# Patient Record
Sex: Male | Born: 1945 | Race: White | Hispanic: No | Marital: Married | Smoking: Former smoker
Health system: Southern US, Community
[De-identification: ages and names within clinical notes are randomized; demographics above are authoritative.]

## PROBLEM LIST (undated history)

## (undated) DIAGNOSIS — E785 Hyperlipidemia, unspecified: Secondary | ICD-10-CM

## (undated) DIAGNOSIS — E8770 Fluid overload, unspecified: Secondary | ICD-10-CM

## (undated) DIAGNOSIS — I4510 Unspecified right bundle-branch block: Secondary | ICD-10-CM

## (undated) DIAGNOSIS — J45909 Unspecified asthma, uncomplicated: Secondary | ICD-10-CM

## (undated) DIAGNOSIS — Z973 Presence of spectacles and contact lenses: Secondary | ICD-10-CM

## (undated) DIAGNOSIS — I10 Essential (primary) hypertension: Secondary | ICD-10-CM

## (undated) DIAGNOSIS — K219 Gastro-esophageal reflux disease without esophagitis: Secondary | ICD-10-CM

## (undated) DIAGNOSIS — I2510 Atherosclerotic heart disease of native coronary artery without angina pectoris: Secondary | ICD-10-CM

## (undated) DIAGNOSIS — R351 Nocturia: Secondary | ICD-10-CM

## (undated) DIAGNOSIS — I219 Acute myocardial infarction, unspecified: Secondary | ICD-10-CM

## (undated) HISTORY — PX: CORONARY ANGIOPLASTY: SHX604

## (undated) HISTORY — PX: COLONOSCOPY: SHX174

## (undated) HISTORY — DX: Fluid overload, unspecified: E87.70

## (undated) HISTORY — DX: Unspecified right bundle-branch block: I45.10

---

## 2016-01-11 ENCOUNTER — Encounter (HOSPITAL_COMMUNITY): Payer: Self-pay

## 2016-01-11 ENCOUNTER — Inpatient Hospital Stay (HOSPITAL_COMMUNITY)
Admission: EM | Admit: 2016-01-11 | Discharge: 2016-01-14 | DRG: 249 | Disposition: A | Discharge status: Home / Self Care | Payer: Medicare Other | Attending: Cardiovascular Disease | Admitting: Cardiovascular Disease

## 2016-01-11 ENCOUNTER — Emergency Department (HOSPITAL_COMMUNITY): Payer: Medicare Other

## 2016-01-11 ENCOUNTER — Encounter (HOSPITAL_COMMUNITY)
Admission: EM | Disposition: A | Discharge status: Home / Self Care | Payer: Self-pay | Source: Home / Self Care | Attending: Cardiovascular Disease

## 2016-01-11 DIAGNOSIS — I2111 ST elevation (STEMI) myocardial infarction involving right coronary artery: Secondary | ICD-10-CM

## 2016-01-11 DIAGNOSIS — I213 ST elevation (STEMI) myocardial infarction of unspecified site: Secondary | ICD-10-CM

## 2016-01-11 DIAGNOSIS — Z8249 Family history of ischemic heart disease and other diseases of the circulatory system: Secondary | ICD-10-CM | POA: Diagnosis not present

## 2016-01-11 DIAGNOSIS — Z955 Presence of coronary angioplasty implant and graft: Secondary | ICD-10-CM

## 2016-01-11 DIAGNOSIS — I2129 ST elevation (STEMI) myocardial infarction involving other sites: Secondary | ICD-10-CM

## 2016-01-11 DIAGNOSIS — I214 Non-ST elevation (NSTEMI) myocardial infarction: Secondary | ICD-10-CM | POA: Diagnosis present

## 2016-01-11 DIAGNOSIS — I110 Hypertensive heart disease with heart failure: Secondary | ICD-10-CM | POA: Diagnosis not present

## 2016-01-11 DIAGNOSIS — I10 Essential (primary) hypertension: Secondary | ICD-10-CM | POA: Diagnosis present

## 2016-01-11 DIAGNOSIS — Z87891 Personal history of nicotine dependence: Secondary | ICD-10-CM | POA: Diagnosis not present

## 2016-01-11 DIAGNOSIS — E785 Hyperlipidemia, unspecified: Secondary | ICD-10-CM | POA: Diagnosis present

## 2016-01-11 DIAGNOSIS — I2510 Atherosclerotic heart disease of native coronary artery without angina pectoris: Secondary | ICD-10-CM | POA: Diagnosis present

## 2016-01-11 HISTORY — PX: CARDIAC CATHETERIZATION: SHX172

## 2016-01-11 HISTORY — DX: Essential (primary) hypertension: I10

## 2016-01-11 HISTORY — DX: Hyperlipidemia, unspecified: E78.5

## 2016-01-11 LAB — CBC
HCT: 42.8 % (ref 39.0–52.0)
Hemoglobin: 14.6 g/dL (ref 13.0–17.0)
MCH: 30.6 pg (ref 26.0–34.0)
MCHC: 34.1 g/dL (ref 30.0–36.0)
MCV: 89.7 fL (ref 78.0–100.0)
PLATELETS: 194 10*3/uL (ref 150–400)
RBC: 4.77 MIL/uL (ref 4.22–5.81)
RDW: 12.7 % (ref 11.5–15.5)
WBC: 13.8 10*3/uL — ABNORMAL HIGH (ref 4.0–10.5)

## 2016-01-11 LAB — BASIC METABOLIC PANEL
Anion gap: 8 (ref 5–15)
BUN: 13 mg/dL (ref 6–20)
CALCIUM: 10.8 mg/dL — AB (ref 8.9–10.3)
CHLORIDE: 105 mmol/L (ref 101–111)
CO2: 23 mmol/L (ref 22–32)
CREATININE: 1.19 mg/dL (ref 0.61–1.24)
GFR calc non Af Amer: >60 mL/min (ref >60)
Glucose, Bld: 112 mg/dL — ABNORMAL HIGH (ref 65–99)
Potassium: 3.8 mmol/L (ref 3.5–5.1)
SODIUM: 136 mmol/L (ref 135–145)

## 2016-01-11 LAB — DIFFERENTIAL
Basophils Absolute: 0 10*3/uL (ref 0.0–0.1)
Basophils Relative: 0 %
EOS ABS: 0.1 10*3/uL (ref 0.0–0.7)
EOS PCT: 1 %
LYMPHS ABS: 1.7 10*3/uL (ref 0.7–4.0)
LYMPHS PCT: 13 %
MONO ABS: 0.8 10*3/uL (ref 0.1–1.0)
MONOS PCT: 6 %
Neutro Abs: 11.2 10*3/uL — ABNORMAL HIGH (ref 1.7–7.7)
Neutrophils Relative %: 81 %

## 2016-01-11 LAB — PROTIME-INR
INR: 1.03 (ref 0.00–1.49)
Prothrombin Time: 13.7 seconds (ref 11.6–15.2)

## 2016-01-11 LAB — I-STAT TROPONIN, ED: TROPONIN I, POC: 1.13 ng/mL — AB (ref 0.00–0.08)

## 2016-01-11 LAB — LIPID PANEL
CHOLESTEROL: 228 mg/dL — AB (ref 0–200)
HDL: 48 mg/dL (ref >40)
LDL CALC: 157 mg/dL — AB (ref 0–99)
TRIGLYCERIDES: 116 mg/dL (ref <150)
Total CHOL/HDL Ratio: 4.8 RATIO
VLDL: 23 mg/dL (ref 0–40)

## 2016-01-11 LAB — APTT: aPTT: 30 seconds (ref 24–37)

## 2016-01-11 LAB — TROPONIN I
TROPONIN I: 0.83 ng/mL — AB (ref <0.03)
TROPONIN I: 14.62 ng/mL — AB (ref <0.03)

## 2016-01-11 LAB — MRSA PCR SCREENING: MRSA by PCR: NEGATIVE

## 2016-01-11 SURGERY — LEFT HEART CATH AND CORONARY ANGIOGRAPHY
Anesthesia: LOCAL

## 2016-01-11 MED ORDER — SODIUM CHLORIDE 0.9 % IV SOLN
INTRAVENOUS | Status: AC
  Administered 2016-01-11: 21:00:00 via INTRAVENOUS

## 2016-01-11 MED ORDER — LIDOCAINE HCL (PF) 1 % IJ SOLN
INTRAMUSCULAR | Status: AC
  Filled 2016-01-11: qty 30

## 2016-01-11 MED ORDER — BIVALIRUDIN BOLUS VIA INFUSION - CUPID
INTRAVENOUS | Status: DC | PRN
  Administered 2016-01-11: 83.325 mg via INTRAVENOUS

## 2016-01-11 MED ORDER — METOPROLOL TARTRATE 25 MG PO TABS
25.0000 mg | ORAL_TABLET | Freq: Two times a day (BID) | ORAL | Status: DC
  Administered 2016-01-12 – 2016-01-14 (×5): 25 mg via ORAL
  Filled 2016-01-11 (×6): qty 1

## 2016-01-11 MED ORDER — ATROPINE SULFATE 1 MG/10ML IJ SOSY
PREFILLED_SYRINGE | INTRAMUSCULAR | Status: AC
  Filled 2016-01-11: qty 10

## 2016-01-11 MED ORDER — FENTANYL CITRATE (PF) 100 MCG/2ML IJ SOLN
INTRAMUSCULAR | Status: DC | PRN
  Administered 2016-01-11: 50 ug via INTRAVENOUS
  Administered 2016-01-11 (×2): 25 ug via INTRAVENOUS

## 2016-01-11 MED ORDER — ATROPINE SULFATE 1 MG/10ML IJ SOSY
PREFILLED_SYRINGE | INTRAMUSCULAR | Status: DC | PRN
  Administered 2016-01-11 (×2): 0.5 mg via INTRAVENOUS

## 2016-01-11 MED ORDER — BIVALIRUDIN 250 MG IV SOLR
INTRAVENOUS | Status: AC
  Filled 2016-01-11: qty 250

## 2016-01-11 MED ORDER — SODIUM CHLORIDE 0.9 % IV SOLN: 10.0000 mL/h | INTRAVENOUS | Status: DC

## 2016-01-11 MED ORDER — FENTANYL CITRATE (PF) 100 MCG/2ML IJ SOLN
INTRAMUSCULAR | Status: AC
  Filled 2016-01-11: qty 2

## 2016-01-11 MED ORDER — TICAGRELOR 90 MG PO TABS
90.0000 mg | ORAL_TABLET | Freq: Two times a day (BID) | ORAL | Status: DC
  Administered 2016-01-12 – 2016-01-14 (×5): 90 mg via ORAL
  Filled 2016-01-11 (×5): qty 1

## 2016-01-11 MED ORDER — ATORVASTATIN CALCIUM 80 MG PO TABS
80.0000 mg | ORAL_TABLET | Freq: Every day | ORAL | Status: DC
  Administered 2016-01-11 – 2016-01-13 (×3): 80 mg via ORAL
  Filled 2016-01-11 (×2): qty 1

## 2016-01-11 MED ORDER — MIDAZOLAM HCL 2 MG/2ML IJ SOLN
INTRAMUSCULAR | Status: DC | PRN
  Administered 2016-01-11: 2 mg via INTRAVENOUS

## 2016-01-11 MED ORDER — MIDAZOLAM HCL 2 MG/2ML IJ SOLN
INTRAMUSCULAR | Status: AC
  Filled 2016-01-11: qty 2

## 2016-01-11 MED ORDER — ASPIRIN 81 MG PO CHEW
81.0000 mg | CHEWABLE_TABLET | Freq: Every day | ORAL | Status: DC
  Administered 2016-01-12 – 2016-01-14 (×3): 81 mg via ORAL
  Filled 2016-01-11 (×3): qty 1

## 2016-01-11 MED ORDER — BIVALIRUDIN 250 MG IV SOLR
250.0000 mg | INTRAVENOUS | Status: DC | PRN
  Administered 2016-01-11: 1.75 mg/kg/h via INTRAVENOUS

## 2016-01-11 MED ORDER — HEPARIN SODIUM (PORCINE) 5000 UNIT/ML IJ SOLN
4000.0000 [IU] | INTRAMUSCULAR | Status: AC
  Administered 2016-01-11: 4000 [IU] via INTRAVENOUS
  Filled 2016-01-11: qty 1

## 2016-01-11 MED ORDER — SODIUM CHLORIDE 0.9% FLUSH: 3.0000 mL | INTRAVENOUS | Status: DC | PRN

## 2016-01-11 MED ORDER — ASPIRIN 81 MG PO CHEW
324.0000 mg | CHEWABLE_TABLET | Freq: Once | ORAL | Status: AC
  Administered 2016-01-11: 324 mg via ORAL
  Filled 2016-01-11: qty 4

## 2016-01-11 MED ORDER — TICAGRELOR 90 MG PO TABS
ORAL_TABLET | ORAL | Status: DC | PRN
  Administered 2016-01-11: 180 mg via ORAL

## 2016-01-11 MED ORDER — VERAPAMIL HCL 2.5 MG/ML IV SOLN
INTRAVENOUS | Status: AC
  Filled 2016-01-11: qty 2

## 2016-01-11 MED ORDER — SODIUM CHLORIDE 0.9% FLUSH
3.0000 mL | Freq: Two times a day (BID) | INTRAVENOUS | Status: DC
  Administered 2016-01-12 – 2016-01-13 (×5): 3 mL via INTRAVENOUS

## 2016-01-11 MED ORDER — IOPAMIDOL (ISOVUE-370) INJECTION 76%
INTRAVENOUS | Status: AC
  Filled 2016-01-11: qty 125

## 2016-01-11 MED ORDER — NITROGLYCERIN 1 MG/10 ML FOR IR/CATH LAB
INTRA_ARTERIAL | Status: AC
Start: 2016-01-11 — End: 2016-01-11
  Filled 2016-01-11: qty 10

## 2016-01-11 MED ORDER — IOPAMIDOL (ISOVUE-370) INJECTION 76%
INTRAVENOUS | Status: AC
Start: 2016-01-11 — End: 2016-01-11
  Filled 2016-01-11: qty 50

## 2016-01-11 MED ORDER — TICAGRELOR 90 MG PO TABS
ORAL_TABLET | ORAL | Status: AC
  Filled 2016-01-11: qty 2

## 2016-01-11 MED ORDER — MORPHINE SULFATE (PF) 2 MG/ML IV SOLN: 2.0000 mg | INTRAVENOUS | Status: DC | PRN

## 2016-01-11 MED ORDER — LIDOCAINE HCL (PF) 1 % IJ SOLN
INTRAMUSCULAR | Status: DC | PRN
  Administered 2016-01-11: 3 mL

## 2016-01-11 MED ORDER — HEPARIN (PORCINE) IN NACL 2-0.9 UNIT/ML-% IJ SOLN
INTRAMUSCULAR | Status: AC
  Filled 2016-01-11: qty 1000

## 2016-01-11 MED ORDER — SODIUM CHLORIDE 0.9 % IV SOLN: 250.0000 mL | INTRAVENOUS | Status: DC | PRN

## 2016-01-11 MED ORDER — ONDANSETRON HCL 4 MG/2ML IJ SOLN: 4.0000 mg | Freq: Four times a day (QID) | INTRAMUSCULAR | Status: DC | PRN

## 2016-01-11 MED ORDER — OXYCODONE-ACETAMINOPHEN 5-325 MG PO TABS: 1.0000 | ORAL_TABLET | ORAL | Status: DC | PRN

## 2016-01-11 MED ORDER — VERAPAMIL HCL 2.5 MG/ML IV SOLN
INTRAVENOUS | Status: DC | PRN
  Administered 2016-01-11: 19:00:00 via INTRA_ARTERIAL

## 2016-01-11 MED ORDER — IOPAMIDOL (ISOVUE-370) INJECTION 76%
INTRAVENOUS | Status: DC | PRN
  Administered 2016-01-11: 150 mL via INTRAVENOUS

## 2016-01-11 MED ORDER — ACETAMINOPHEN 325 MG PO TABS: 650.0000 mg | ORAL_TABLET | ORAL | Status: DC | PRN

## 2016-01-11 SURGICAL SUPPLY — 19 items
BALLN EMERGE MR 2.0X12 (BALLOONS) ×2
BALLN ~~LOC~~ EMERGE MR 2.75X15 (BALLOONS) ×2
BALLOON EMERGE MR 2.0X12 (BALLOONS) ×1 IMPLANT
BALLOON ~~LOC~~ EMERGE MR 2.75X15 (BALLOONS) ×1 IMPLANT
CATH INFINITI 5 FR JL3.5 (CATHETERS) ×2 IMPLANT
CATH INFINITI 5FR ANG PIGTAIL (CATHETERS) ×2 IMPLANT
CATH INFINITI JR4 5F (CATHETERS) ×2 IMPLANT
CATH VISTA GUIDE 6FR JR4 (CATHETERS) ×2 IMPLANT
DEVICE RAD COMP TR BAND LRG (VASCULAR PRODUCTS) ×2 IMPLANT
GLIDESHEATH SLEND SS 6F .021 (SHEATH) ×2 IMPLANT
KIT ENCORE 26 ADVANTAGE (KITS) ×2 IMPLANT
KIT HEART LEFT (KITS) ×2 IMPLANT
PACK CARDIAC CATHETERIZATION (CUSTOM PROCEDURE TRAY) ×2 IMPLANT
STENT NIRXCELL 2.5X20 (Permanent Stent) ×2 IMPLANT
SYR MEDRAD MARK V 150ML (SYRINGE) ×2 IMPLANT
TRANSDUCER W/STOPCOCK (MISCELLANEOUS) ×2 IMPLANT
TUBING CIL FLEX 10 FLL-RA (TUBING) ×2 IMPLANT
WIRE COUGAR XT STRL 190CM (WIRE) ×2 IMPLANT
WIRE SAFE-T 1.5MM-J .035X260CM (WIRE) ×2 IMPLANT

## 2016-01-11 NOTE — H&P (Signed)
Patient ID: Bobby Price MRN: [id number] DOB/AGE: 1945-11-07 70 y.o. Admit date: 01/11/2016  Primary Care Physician: No primary care provider on file. Primary Cardiologist: New  HPI: 70 yo male with history of HTN, HLD who presented to the ED today with c/o chest pain x 8 hours associated with nausea. EKG with 1 mm ST elevation lead III. No reciprocal changes noted. Pt with ongoing pain. Cath lab activated for urgent cath. Pt denies prior heart disease and states that he has never been in a hospital. He used to be on BP and cholesterol medications but has not been taking. Smoked 4ppd but quit in 1976.   Review of systems complete and found to be negative unless listed above   Past Medical History  Diagnosis Date  . Hypertension   . Hyperlipidemia     Family History  Problem Relation Age of Onset  . Heart attack Maternal Grandfather     Social History   Social History  . Marital Status: N/A    Spouse Name: N/A  . Number of Children: N/A  . Years of Education: N/A   Occupational History  . Not on file.   Social History Main Topics  . Smoking status: Former Smoker -- 4.00 packs/day for 10 years    Types: Cigarettes  . Smokeless tobacco: Not on file  . Alcohol Use: 0.0 oz/week    0 Standard drinks or equivalent per week     Comment: He drinks up to 6 beers per day  . Drug Use: No  . Sexual Activity: Not on file   Other Topics Concern  . Not on file   Social History Narrative  . No narrative on file    Past Surgical History  Procedure Laterality Date  . None      No Known Allergies  Prior to Admission Meds:  None  Physical Exam: Blood pressure 118/62, pulse 47, temperature 99.1 F (37.3 C), temperature source Oral, resp. rate 17, height 6\' 2"  (1.88 m), weight 245 lb (111.131 kg), SpO2 99 %.    General: Well developed, well nourished, NAD  HEENT: OP clear, mucus membranes moist  SKIN: warm, dry. No rashes.  Neuro: No focal deficits  Musculoskeletal:  Muscle strength 5/5 all ext  Psychiatric: Mood and affect normal  Neck: No JVD, no carotid bruits, no thyromegaly, no lymphadenopathy.  Lungs:Clear bilaterally, no wheezes, rhonci, crackles  Cardiovascular: Regular rate and rhythm. No murmurs, gallops or rubs.  Abdomen:Soft. Bowel sounds present. Non-tender.  Extremities: No lower extremity edema. Pulses are 2 + in the bilateral DP/PT.  Labs:   Lab Results  Component Value Date   WBC 13.8* 01/11/2016   HGB 14.6 01/11/2016   HCT 42.8 01/11/2016   MCV 89.7 01/11/2016   PLT 194 01/11/2016   No results for input(s): NA, K, CL, CO2, BUN, CREATININE, CALCIUM, PROT, BILITOT, ALKPHOS, ALT, AST, GLUCOSE in the last 168 hours.  Invalid input(s): LABALBU No results found for: CKTOTAL, CKMB, CKMBINDEX, TROPONINI No results found for: CHOL No results found for: HDL No results found for: LDLCALC No results found for: TRIG No results found for: CHOLHDL No results found for: LDLDIRECT     EKG: sinus bradycardia. 1 mm ST elevation lead III.   ASSESSMENT AND PLAN:   1. Acute coronary syndrome/NSTEMI: Pt with ongoing chest pain. EKG not diagnostic for STEMI but concerning for acute MI. Troponin 1.1. Will plan urgent cath tonight. Further plans to follow after cath.   Earney Hamburghris Deriana Vanderhoef,  MD 01/11/2016, 6:58 PM

## 2016-01-11 NOTE — ED Notes (Signed)
Patient complains of central chest pain since 10am, states he thought indigestion after eating sausage this am.  Nausea with ame. No other associated symptoms. Took antacids with no relief

## 2016-01-11 NOTE — ED Notes (Signed)
stemi pads placed on patient. Xray at bedside.

## 2016-01-11 NOTE — Progress Notes (Signed)
Pt. Arrived from cath lab at around 22:30.  Troponin at 14.62.  Pt. HR is sustained in low 50's, dropping sometimes to mid 40's.  MD made aware, said as long as asymptomatic that HR is ok.  If it goes below 35 to call back. Will continue to monitor.

## 2016-01-11 NOTE — ED Provider Notes (Signed)
CSN: [id number]     Arrival date & time 01/11/16  1756 History   First MD Initiated Contact with Patient 01/11/16 1815     No chief complaint on file.    (Consider location/radiation/quality/duration/timing/severity/associated sxs/prior Treatment) HPI Comments: Pt comes in with cc of chest pain. PT started having midsternal and substernal chest discomfort at 10 am, after breakfast. He thought he was having GERD. Since then the pain has been constant, 3/10. He denies any specific aggravating or relieving factors or radiation of the pain. Pt has had some feeling of shallow respiration and also episodes of nausea. Pt also reports 2 episodes of feelign clammy. Decided to come to the ER as the pain is not going away.   ROS 10 Systems reviewed and are negative for acute change except as noted in the HPI.     The history is provided by the patient.    Past Medical History  Diagnosis Date  . Hypertension    History reviewed. No pertinent past surgical history. No family history on file. Social History  Substance Use Topics  . Smoking status: Never Smoker   . Smokeless tobacco: None  . Alcohol Use: None    Review of Systems    Allergies  Review of patient's allergies indicates no known allergies.  Home Medications   Prior to Admission medications   Not on File   BP 140/73 mmHg  Pulse 49  Temp(Src) 99.1 F (37.3 C) (Oral)  Resp 18  Ht 6\' 2"  (1.88 m)  Wt 245 lb (111.131 kg)  BMI 31.44 kg/m2  SpO2 100% Physical Exam  Constitutional: He is oriented to person, place, and time. He appears well-developed.  HENT:  Head: Normocephalic and atraumatic.  Eyes: Conjunctivae and EOM are normal. Pupils are equal, round, and reactive to light.  Neck: Normal range of motion. Neck supple. No JVD present.  Cardiovascular: Normal rate, regular rhythm, normal heart sounds and intact distal pulses.   No murmur heard. Pulmonary/Chest: Effort normal and breath sounds normal. No  respiratory distress. He has no wheezes.  Abdominal: Soft. Bowel sounds are normal. He exhibits no distension. There is no tenderness. There is no rebound and no guarding.  Neurological: He is alert and oriented to person, place, and time.  Skin: Skin is warm.  Nursing note and vitals reviewed.   ED Course  .Critical Care Performed by: Derwood KaplanNANAVATI, Ryane Canavan Authorized by: Derwood KaplanNANAVATI, Zidan Helget Total critical care time: 38 minutes Critical care time was exclusive of separately billable procedures and treating other patients. Critical care was necessary to treat or prevent imminent or life-threatening deterioration of the following conditions: cardiac failure. Critical care was time spent personally by me on the following activities: blood draw for specimens, development of treatment plan with patient or surrogate, discussions with consultants, evaluation of patient's response to treatment, examination of patient, obtaining history from patient or surrogate, ordering and review of laboratory studies and pulse oximetry.   (including critical care time) Labs Review Labs Reviewed  I-STAT TROPOININ, ED - Abnormal; Notable for the following:    Troponin i, poc 1.13 (*)    All other components within normal limits  BASIC METABOLIC PANEL  CBC  DIFFERENTIAL  PROTIME-INR  APTT  TROPONIN I  LIPID PANEL  I-STAT TROPOININ, ED    Imaging Review No results found. I have personally reviewed and evaluated these images and lab results as part of my medical decision-making.   EKG Interpretation  EKG # 3 Date/Time:  Saturday January 11 2016  18:10:59 EDT Ventricular Rate:  44 PR Interval:  162 QRS Duration: 104 QT Interval:  430 QTC Calculation: 367 R Axis:   67 Text Interpretation:  Marked sinus bradycardia ST elevation consider  inferior injury or acute infarct - lead III and avF v@ has ST depression  Confirmed by Rhunette CroftNANAVATI, MD, Nillie Bartolotta [phone]) on 01/11/2016 6:20:49 PM   ekg # 4  EKG  Interpretation  Date/Time:  Saturday January 11 2016 18:23:20 EDT Ventricular Rate:  48 PR Interval:  162 QRS Duration: 113 QT Interval:  436 QTC Calculation: 390 R Axis:   65 Text Interpretation:  Sinus bradycardia Incomplete right bundle branch block ST elevation, consider inferior injury ST ELEVATION IN THE INFERIOR leads III and aVF, mild depression in avL Confirmed by Reuven Braver, MD, Janey GentaANKIT ([id number]) on 01/11/2016 6:33:21 PM           MDM   Final diagnoses:  Acute ST elevation myocardial infarction (STEMI) of posterior wall (HCC)    .Differential diagnosis includes: ACS syndrome CHF exacerbation Myocarditis Pericarditis GERD Esophageal spasm  Pt comes in with cc of chest pain. Hx of HTN. No hx of similar pain in the past. Pain started few minutes after breakfast, and is constant. Pain is not exertional. Pt has some dyspnea and nausea, and sweats. Initial ekg x 2 has some subtle ST elevation in the inferior leads, most pronounced in the lead III Story was concerning, but has some atypical features - and we repeated ekg x 2 more times - and got a posterior ekg, and it seems like ST elevation has worsened slightly in the inferior leads and posterior leads have slight ST elevation - CODE STEMI activated before 6:30pm.  Trop is elevated slightly.  Derwood KaplanAnkit Leroy Pettway, MD 01/11/16 [phone]

## 2016-01-11 NOTE — Progress Notes (Signed)
   01/11/16 1830  Clinical Encounter Type  Visited With Patient  Visit Type ED  Referral From Nurse  Spiritual Encounters  Spiritual Needs Emotional  Chaplain responded to a Code Stemi in the ED.  Chaplain checked in with the nurse.  Chaplain visited with patient.  Patient was alert and smiling.  Chaplain offered spiritual support and made further support available if needed.

## 2016-01-12 ENCOUNTER — Inpatient Hospital Stay (HOSPITAL_COMMUNITY): Payer: Medicare Other

## 2016-01-12 DIAGNOSIS — I2510 Atherosclerotic heart disease of native coronary artery without angina pectoris: Secondary | ICD-10-CM

## 2016-01-12 DIAGNOSIS — I213 ST elevation (STEMI) myocardial infarction of unspecified site: Secondary | ICD-10-CM

## 2016-01-12 LAB — CBC
HEMATOCRIT: 37.3 % — AB (ref 39.0–52.0)
HEMOGLOBIN: 12.3 g/dL — AB (ref 13.0–17.0)
MCH: 29.6 pg (ref 26.0–34.0)
MCHC: 33 g/dL (ref 30.0–36.0)
MCV: 89.9 fL (ref 78.0–100.0)
Platelets: 159 10*3/uL (ref 150–400)
RBC: 4.15 MIL/uL — AB (ref 4.22–5.81)
RDW: 13.1 % (ref 11.5–15.5)
WBC: 11.9 10*3/uL — AB (ref 4.0–10.5)

## 2016-01-12 LAB — ECHOCARDIOGRAM COMPLETE
E decel time: 215 msec
E/e' ratio: 9.73
FS: 20 % — AB (ref 28–44)
HEIGHTINCHES: 74 in
IVS/LV PW RATIO, ED: 1.03
LA ID, A-P, ES: 42 mm
LA diam index: 1.75 cm/m2
LA vol A4C: 42.9 ml
LA vol index: 19.9 mL/m2
LA vol: 47.7 mL
LDCA: 2.27 cm2
LEFT ATRIUM END SYS DIAM: 42 mm
LV TDI E'MEDIAL: 8.92
LV e' LATERAL: 9.57 cm/s
LVEEAVG: 9.73
LVEEMED: 9.73
LVOT VTI: 24.2 cm
LVOT peak vel: 107 cm/s
LVOTD: 17 mm
LVOTSV: 55 mL
MV Dec: 215
MV pk A vel: 83.9 m/s
MV pk E vel: 93.1 m/s
MVPG: 3 mmHg
PW: 14.7 mm — AB (ref 0.6–1.1)
TDI e' lateral: 9.57
WEIGHTICAEL: 3816.6 [oz_av]

## 2016-01-12 LAB — BASIC METABOLIC PANEL
Anion gap: 8 (ref 5–15)
BUN: 11 mg/dL (ref 6–20)
CALCIUM: 9.3 mg/dL (ref 8.9–10.3)
CO2: 22 mmol/L (ref 22–32)
CREATININE: 1.05 mg/dL (ref 0.61–1.24)
Chloride: 105 mmol/L (ref 101–111)
GFR calc Af Amer: >60 mL/min (ref >60)
GLUCOSE: 118 mg/dL — AB (ref 65–99)
POTASSIUM: 3.6 mmol/L (ref 3.5–5.1)
SODIUM: 135 mmol/L (ref 135–145)

## 2016-01-12 LAB — TROPONIN I
Troponin I: 38.63 ng/mL (ref <0.03)
Troponin I: 41.98 ng/mL (ref <0.03)

## 2016-01-12 MED ORDER — PERFLUTREN LIPID MICROSPHERE
1.0000 mL | INTRAVENOUS | Status: AC | PRN
  Administered 2016-01-12: 2 mL via INTRAVENOUS
  Filled 2016-01-12: qty 10

## 2016-01-12 MED ORDER — ATROPINE SULFATE 1 MG/10ML IJ SOSY
PREFILLED_SYRINGE | INTRAMUSCULAR | Status: AC
  Filled 2016-01-12: qty 10

## 2016-01-12 MED ORDER — PERFLUTREN LIPID MICROSPHERE
INTRAVENOUS | Status: AC
  Administered 2016-01-12: 2 mL
  Filled 2016-01-12: qty 10

## 2016-01-12 NOTE — Progress Notes (Signed)
TR band off at 0100.  Dressing applied.  Will continue to monitor.

## 2016-01-12 NOTE — Progress Notes (Signed)
Utilization Review Completed.Bobby Price T7/08/2015  

## 2016-01-12 NOTE — Progress Notes (Signed)
   SUBJECTIVE: The patient is doing well today.  At this time, he denies chest pain, shortness of breath, or any new concerns.  Marland Kitchen. aspirin  81 mg Oral Daily  . atorvastatin  80 mg Oral q1800  . atropine      . metoprolol tartrate  25 mg Oral BID  . sodium chloride flush  3 mL Intravenous Q12H  . ticagrelor  90 mg Oral BID   . sodium chloride    . sodium chloride      OBJECTIVE: Physical Exam: Filed Vitals:   01/12/16 0500 01/12/16 0600 01/12/16 0700 01/12/16 0800  BP: 104/63 98/55 91/60  100/62  Pulse: 45 42 46 45  Temp:    98.2 F (36.8 C)  TempSrc:    Oral  Resp: 14 23 11 12   Height:      Weight:      SpO2: 97% 96% 97% 97%    Intake/Output Summary (Last 24 hours) at 01/12/16 0948 Last data filed at 01/12/16 0500  Gross per 24 hour  Intake 713.09 ml  Output    700 ml  Net  13.09 ml    Telemetry reveals sinus rhythm with occasional sinus bradycardia  GEN- The patient is well appearing, alert and oriented x 3 today.   Head- normocephalic, atraumatic Eyes-  Sclera clear, conjunctiva pink Ears- hearing intact Oropharynx- clear Neck- supple,  Lungs- Clear to ausculation bilaterally, normal work of breathing Heart- Regular rate and rhythm, no murmurs, rubs or gallops, PMI not laterally displaced GI- soft, NT, ND, + BS Extremities- no clubbing, cyanosis, or edema Skin- no rash or lesion Psych- euthymic mood, full affect Neuro- strength and sensation are intact  LABS: Basic Metabolic Panel:  Recent Labs  45/40/9805/07/29 1807 01/12/16 0224  NA 136 135  K 3.8 3.6  CL 105 105  CO2 23 22  GLUCOSE 112* 118*  BUN 13 11  CREATININE 1.19 1.05  CALCIUM 10.8* 9.3   Liver Function Tests: No results for input(s): AST, ALT, ALKPHOS, BILITOT, PROT, ALBUMIN in the last 72 hours. No results for input(s): LIPASE, AMYLASE in the last 72 hours. CBC:  Recent Labs  01/11/16 1807 01/12/16 0224  WBC 13.8* 11.9*  NEUTROABS 11.2*  --   HGB 14.6 12.3*  HCT 42.8 37.3*  MCV  89.7 89.9  PLT 194 159   Cardiac Enzymes:  Recent Labs  01/11/16 2059 01/12/16 0224 01/12/16 0827  TROPONINI 14.62* 38.63* 41.98*   Fasting Lipid Panel:  Recent Labs  01/11/16 1807  CHOL 228*  HDL 48  LDLCALC 157*  TRIG 116  CHOLHDL 4.8   ASSESSMENT AND PLAN:  Active Problems:   NSTEMI (non-ST elevated myocardial infarction) (HCC)  1. NSTEMI Doing well s/p PCI of RCA.  Pt with 3 vessel disease.  Per Dr Clifton JamesMcAlhany, plan is for cath films to be reviewed with surgery on Monday with consideration of CABG in 1 month.  I have discussed this plan with the patient.  He is reluctant to consider CABG as his wife has end stage COPD on 5L O2 and is dependant on him for the majority of his care. For now, will continue to optimize medical therapy until surgical discussion can be had tomorrow. Echo pending On ASA, ticagrelor, high dose atorvastatin, metoprolol  2. HTN Stable No change required today Improved  Downgrade to stepdown  Hillis RangeJames Otho Michalik, MD 01/12/2016 9:48 AM

## 2016-01-12 NOTE — Progress Notes (Signed)
Echocardiogram 2D Echocardiogram with Definity has been performed.  Bobby Price, Bobby 01/12/2016, 2:44 PM

## 2016-01-13 ENCOUNTER — Encounter (HOSPITAL_COMMUNITY): Payer: Self-pay | Admitting: Cardiovascular Disease

## 2016-01-13 ENCOUNTER — Other Ambulatory Visit: Payer: Self-pay | Admitting: *Deleted

## 2016-01-13 DIAGNOSIS — I10 Essential (primary) hypertension: Secondary | ICD-10-CM

## 2016-01-13 DIAGNOSIS — I214 Non-ST elevation (NSTEMI) myocardial infarction: Principal | ICD-10-CM

## 2016-01-13 DIAGNOSIS — I25110 Atherosclerotic heart disease of native coronary artery with unstable angina pectoris: Secondary | ICD-10-CM

## 2016-01-13 DIAGNOSIS — I2510 Atherosclerotic heart disease of native coronary artery without angina pectoris: Secondary | ICD-10-CM

## 2016-01-13 LAB — POCT ACTIVATED CLOTTING TIME: Activated Clotting Time: 577 seconds

## 2016-01-13 MED FILL — Heparin Sodium (Porcine) 2 Unit/ML in Sodium Chloride 0.9%: INTRAMUSCULAR | Qty: 500 | Status: AC

## 2016-01-13 MED FILL — Nitroglycerin IV Soln 100 MCG/ML in D5W: INTRA_ARTERIAL | Qty: 10 | Status: AC

## 2016-01-13 NOTE — Progress Notes (Signed)
SUBJECTIVE: No chest pain or dyspnea.   Tele: sinus  BP 106/64 mmHg  Pulse 60  Temp(Src) 98.4 F (36.9 C) (Oral)  Resp 12  Ht 6\' 2"  (1.88 m)  Wt 238 lb 8.6 oz (108.2 kg)  BMI 30.61 kg/m2  SpO2 97%  Intake/Output Summary (Last 24 hours) at 01/13/16 0754 Last data filed at 01/13/16 0000  Gross per 24 hour  Intake      0 ml  Output   1750 ml  Net  -1750 ml    PHYSICAL EXAM General: Well developed, well nourished, in no acute distress. Alert and oriented x 3.  Psych:  Good affect, responds appropriately Neck: No JVD. No masses noted.  Lungs: Clear bilaterally with no wheezes or rhonci noted.  Heart: RRR with no murmurs noted. Abdomen: Bowel sounds are present. Soft, non-tender.  Extremities: No lower extremity edema.   LABS: Basic Metabolic Panel:  Recent Labs  16/04/9606/01/17 1807 01/12/16 0224  NA 136 135  K 3.8 3.6  CL 105 105  CO2 23 22  GLUCOSE 112* 118*  BUN 13 11  CREATININE 1.19 1.05  CALCIUM 10.8* 9.3   CBC:  Recent Labs  01/11/16 1807 01/12/16 0224  WBC 13.8* 11.9*  NEUTROABS 11.2*  --   HGB 14.6 12.3*  HCT 42.8 37.3*  MCV 89.7 89.9  PLT 194 159   Cardiac Enzymes:  Recent Labs  01/11/16 2059 01/12/16 0224 01/12/16 0827  TROPONINI 14.62* 38.63* 41.98*   Fasting Lipid Panel:  Recent Labs  01/11/16 1807  CHOL 228*  HDL 48  LDLCALC 157*  TRIG 116  CHOLHDL 4.8    Current Meds: . aspirin  81 mg Oral Daily  . atorvastatin  80 mg Oral q1800  . metoprolol tartrate  25 mg Oral BID  . sodium chloride flush  3 mL Intravenous Q12H  . ticagrelor  90 mg Oral BID   Echo 01/12/16: Procedure narrative: Transthoracic echocardiography. Technically  difficult study with reduced echocardiographic windows and poor  endocardial definition. Intravenous contrast (Definity) was  administered. - Left ventricle: The cavity size was normal. There was moderate  concentric hypertrophy. Systolic function was normal. The  estimated ejection  fraction was in the range of 60% to 65%.  Definity contrast images demonstrate normal wall motion. No LV  thrombus was noted. The study is not technically sufficient to  allow evaluation of LV diastolic function. - Mitral valve: Mildly thickened leaflets . There was trivial  regurgitation. - Left atrium: The atrium was normal in size. - Inferior vena cava: The vessel was dilated. The respirophasic  diameter changes were blunted (< 50%), consistent with elevated  central venous pressure. Impressions: - Technically difficult study. Contrast was given. Wall motion  appears normal. LVEF 60-65%.  Cardiac cath 01/11/16:  Mid RCA lesion, 50% stenosed.  Ost Cx to Prox Cx lesion, 50% stenosed.  LM-1 lesion, 50% stenosed.  LM-2 lesion, 80% stenosed.  Prox Cx lesion, 70% stenosed.  Ost 2nd Mrg to 2nd Mrg lesion, 70% stenosed.  Prox LAD to Mid LAD lesion, 20% stenosed.  Dist LAD-1 lesion, 50% stenosed.  Dist LAD-2 lesion, 20% stenosed.  Dist RCA lesion, 100% stenosed. Post intervention, there is a 0% residual stenosis.  The left ventricular systolic function is normal.  1. Acute MI/NSTEMI secondary to occluded distal RCA 2. Successful PTCA/bare metal stent placement x 1 distal RCA  3. There is complex disease involving the distal left main (50%), ostial LAD (80%) and ostial Circumflex (  50%). The mid RCA has moderate stenosis. The proximal Circumflex and OM1 has moderately severe stenosis.  4. Overall preserved LV systolic function  ASSESSMENT AND PLAN:  1. CAD/NSTEMI:  Pt admitted 01/11/16 with chest pain, NSTEMI. The distal RCA was occluded and treated with a bare metal stent. He has high grade ostial LAD disease with moderate disease in the distal left main and ostial Circumflex. The plan will be to involve CT surgery to discuss bypass of the LAD and Circumflex. I will continue ASA and Brilinta along with statin and beta blocker for at least one month. Since a bare metal  stent was placed in the RCA, we could stop Brilinta in one month for CABG. His anatomy is not favorable for PCI/stenting of the LAD given the distal left main stenosis and ostial Circumflex disease.  He is doing well this am. Will transfer to telemetry unit today.   2. HTN: BP controlled. Continue beta blocker.   Verne Carrowhristopher Macklyn Glandon  7/3/20177:54 AM

## 2016-01-13 NOTE — Progress Notes (Signed)
CARDIAC REHAB PHASE I   PRE:  Rate/Rhythm: 61 SR  BP:  Sitting: 133/76        SaO2: 98 RA  MODE:  Ambulation: 550 ft   POST:  Rate/Rhythm: 58 SB  BP:  Sitting: 140/82         SaO2: 99 RA  Pt ambulated 550 ft on RA, independent, steady gait, tolerated well, denies any complaints. Pt states he is eager to get home to his wife who he cares for. Completed MI/stent education.  Reviewed risk factors, MI book, anti-platelet therapy, stent card (pt did not receive stent card), activity restrictions, ntg, exercise, heart healthy diet and phase 2 cardiac rehab. Pt verbalized understanding. Pt agrees to phase 2 cardiac rehab referral, although pt states it is unlikely he will attend. [facility]. Pt to recliner after walk, call bell within reach. Will follow up Wednesday if pt does not discharge tomorrow.   [phone] Joylene GrapesEmily C Arno Cullers, RN, BSN 01/13/2016 11:29 AM

## 2016-01-13 NOTE — Consult Note (Signed)
[address]       [place] [address]             [phone]        Bobby Price [facility] Medical Record #[id number] Date of Birth: May 01, 1946  Referring: No ref. provider found Primary Care: No primary care provider on file.  Chief Complaint:    Chief Complaint  Patient presents with  . 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 The patient presented to the emergency department and was noted have slight 1 mm ST segment elevation in the inferior lead. The patient has no previous history of hospitalizations or health problems. The patient's troponin was 1.1 and he underwent urgent cardiac catheterization which demonstrated occlusion of the RCA. This was treated with a bare metal stent. The patient also had significant disease in the proximal LAD and circumflex. LV function is normal. Echo shows no significant valvular disease. His cardiologist felt that he should undergo surgical coronary revascularization in the future after he recovers from his DMI    [troponin still elevating 42.0] and after the antiplatelet agents can be stopped safely. The patient currently is on Brilinta. He feels well without pain or shortness of breath. Cardiac cath site in right wrist is without hematoma or bleeding.  Current Activity/ Functional Status: Patient lives with his wife who is fairly invalid and takes care of the home and children and grandchildren   Zubrod Score: At the time of surgery this patient's most appropriate activity status/level should be described as:     0    Normal activity, no symptoms     1    Restricted in physical strenuous activity but ambulatory, able to do out  light work     2    Ambulatory and capable of self care, unable to do work activities, up and about                 more than 50%  Of the time                                3    Only limited self care, in bed greater than 50% of waking hours     4    Completely disabled, no self care, confined to bed or chair     5    Moribund  Past Medical History  Diagnosis Date  . Hypertension   . Hyperlipidemia     Past Surgical History  Procedure Laterality Date  . None    . Cardiac catheterization N/A 01/11/2016    Procedure: Left Heart Cath and Coronary Angiography;  Surgeon: [name], MD;  Location: [facility] INVASIVE CV LAB;  Service: Cardiovascular;  Laterality: N/A;    History  Smoking status  . Former Smoker -- 4.00 packs/day for 10 years  . Types: Cigarettes  Smokeless tobacco  . Not on file    History  Alcohol Use  . 0.0 oz/week  . 0 Standard drinks or equivalent per week    Comment: He drinks up to 6 beers per day    Social History   Social History  .  Marital Status: N/A    Spouse Name: N/A  . Number of Children: N/A  . Years of Education: N/A   Occupational History  . Not on file.   Social History Main Topics  . Smoking status: Former Smoker -- 4.00 packs/day for 10 years    Types: Cigarettes  . Smokeless tobacco: Not on file  . Alcohol Use: 0.0 oz/week    0 Standard drinks or equivalent per week     Comment: He drinks up to 6 beers per day  . Drug Use: No  . Sexual Activity: Not on file   Other Topics Concern  . Not on file   Social History Narrative    No Known Allergies  Current Facility-Administered Medications  Medication Dose Route Frequency Provider Last Rate Last Dose  . 0.9 %  sodium chloride infusion  10-20 mL/hr Intravenous Continuous Derwood KaplanAnkit Nanavati, MD   Stopped at 01/11/16 1830  . 0.9 %  sodium chloride infusion  10-20 mL/hr Intravenous Continuous Ankit Nanavati, MD      . 0.9 %  sodium chloride infusion  250 mL Intravenous  PRN Kathleene Hazelhristopher D McAlhany, MD      . acetaminophen (TYLENOL) tablet 650 mg  650 mg Oral Q4H PRN Kathleene Hazelhristopher D McAlhany, MD      . aspirin chewable tablet 81 mg  81 mg Oral Daily Kathleene Hazelhristopher D McAlhany, MD   81 mg at 01/13/16 0906  . atorvastatin (LIPITOR) tablet 80 mg  80 mg Oral q1800 Kathleene Hazelhristopher D McAlhany, MD   80 mg at 01/13/16 1859  . metoprolol tartrate (LOPRESSOR) tablet 25 mg  25 mg Oral BID Kathleene Hazelhristopher D McAlhany, MD   25 mg at 01/13/16 0906  . morphine 2 MG/ML injection 2 mg  2 mg Intravenous Q1H PRN Kathleene Hazelhristopher D McAlhany, MD      . ondansetron [facility](ZOFRAN) injection 4 mg  4 mg Intravenous Q6H PRN Kathleene Hazelhristopher D McAlhany, MD      . oxyCODONE-acetaminophen (PERCOCET/ROXICET) 5-325 MG per tablet 1-2 tablet  1-2 tablet Oral Q4H PRN Kathleene Hazelhristopher D McAlhany, MD      . sodium chloride flush (NS) 0.9 % injection 3 mL  3 mL Intravenous Q12H Kathleene Hazelhristopher D McAlhany, MD   3 mL at 01/13/16 0906  . sodium chloride flush (NS) 0.9 % injection 3 mL  3 mL Intravenous PRN Kathleene Hazelhristopher D McAlhany, MD      . ticagrelor [facility](BRILINTA) tablet 90 mg  90 mg Oral BID Kathleene Hazelhristopher D McAlhany, MD   90 mg at 01/13/16 [id number]    Prescriptions prior to admission  Medication Sig Dispense Refill Last Dose  . Travoprost, BAK Free, (TRAVATAN) 0.004 % SOLN ophthalmic solution Place 1 drop into both eyes daily as needed (spots in front of eyes).   couple days ago    Family History  Problem Relation Age of Onset  . Heart attack Maternal Grandfather      Review of Systems:       Cardiac Review of Systems: Y or N  Chest Pain [  Y   ]  Resting SOB [ no  ] Exertional SOB no   Orthopnea [ no ]   Pedal Edema [ n  ]    Palpitations [n  ] Syncope  [n  ]   Presyncope [ n  ] nausea-Y  General Review of Systems: [Y] = yes [  ]=no Constitional: recent weight change [  ]; anorexia [  ]; fatigue [  ]; nausea [  ]; night sweats [  ];  fever [  ]; or chills [  ]                                                               Dental: poor  dentition[  ]; Last Dentist visit: One year  Eye : blurred vision [  ]; diplopia [   ]; vision changes [  ];  Amaurosis fugax[  ]; Resp: cough [  ];  wheezing[  ];  hemoptysis[  ]; shortness of breath[  ]; paroxysmal nocturnal dyspnea[  ]; dyspnea on exertion[  ]; or orthopnea[  ];  GI:  gallstones[  ], vomiting[  ];  dysphagia[  ]; melena[  ];  hematochezia [  ]; heartburn[ yes  ];   Hx of  Colonoscopy[  ]; GU: kidney stones [  ]; hematuria[  ];   dysuria [  ];  nocturia[  ];  history of     obstruction [  ]; urinary frequency [  ]             Skin: rash, swelling[  ];, hair loss[  ];  peripheral edema[  ];  or itching[  ]; Musculosketetal: myalgias[  ];  joint swelling[  ];  joint erythema[  ];  joint pain[  ];  back pain[  ];  Heme/Lymph: bruising[  ];  bleeding[  ];  anemia[  ];  Neuro: TIA[  ];  headaches[  ];  stroke[  ];  vertigo[  ];  seizures[  ];   paresthesias[  ];  difficulty walking[  ];  Psych:depression[  ]; anxiety[  ];  Endocrine: diabetes[  ];  thyroid dysfunction[  ];  Immunizations: Flu [  ]; Pneumococcal[  ];  Other: Right-hand dominant, no history of thoracic trauma  Physical Exam: BP 108/61 mmHg  Pulse 57  Temp(Src) 98.1 F (36.7 C) (Oral)  Resp 18  Ht 6\' 2"  (1.88 m)  Wt 238 lb 8.6 oz (108.2 kg)  BMI 30.61 kg/m2  SpO2 100%       Physical Exam  General: Middle-aged well-developed Caucasian male no acute distress HEENT: Normocephalic pupils equal , dentition adequate Neck: Supple without JVD, adenopathy, or bruit Chest: Clear to auscultation, symmetrical breath sounds, no rhonchi, no tenderness             or deformity Cardiovascular: Regular rate and rhythm, no murmur, no gallop, peripheral pulses             palpable in all extremities Abdomen:  Soft, nontender, no palpable mass or organomegaly Extremities: Warm, well-perfused, no clubbing cyanosis edema or tenderness,              no venous stasis changes of the legs Rectal/GU: Deferred Neuro: Grossly  non--focal and symmetrical throughout Skin: Clean and dry without rash or ulceration   Diagnostic Studies & Laboratory data:     Recent Radiology Findings:   No results found.   I have independently reviewed the above radiologic studies.  Recent Lab Findings: Lab Results  Component Value Date   WBC 11.9* 01/12/2016   HGB 12.3* 01/12/2016   HCT 37.3* 01/12/2016   PLT 159 01/12/2016   GLUCOSE 118* 01/12/2016   CHOL 228* 01/11/2016   TRIG 116 01/11/2016   HDL 48 01/11/2016   LDLCALC 157*  01/11/2016   NA 135 01/12/2016   K 3.6 01/12/2016   CL 105 01/12/2016   CREATININE 1.05 01/12/2016   BUN 11 01/12/2016   CO2 22 01/12/2016   INR 1.03 01/11/2016      Assessment / Plan:    ST elevation MI- Acute D MI from occlusion of the RCA treated with PCI and bare-metal stent of distal RCA Troponin 42.0 and rising Residual multivessel high-grade proximal CAD Preserved LV systolic function Sixpack of beer intake daily  We'll plan on following up with the patient in the office to discuss multivessel CABG after his Brilinta can be safely stopped. Currently the patient is very hesitant to undergo CABG.   @ME1 @ 01/13/2016 7:19 PM

## 2016-01-13 NOTE — Progress Notes (Addendum)
Gave pt 30day free brilinta card. Spoke w pt and he does have medicare d plan that covers meds.

## 2016-01-14 ENCOUNTER — Inpatient Hospital Stay (HOSPITAL_COMMUNITY): Payer: Medicare Other

## 2016-01-14 LAB — CBC
HEMATOCRIT: 40.8 % (ref 39.0–52.0)
Hemoglobin: 13.3 g/dL (ref 13.0–17.0)
MCH: 29.7 pg (ref 26.0–34.0)
MCHC: 32.6 g/dL (ref 30.0–36.0)
MCV: 91.1 fL (ref 78.0–100.0)
PLATELETS: 177 10*3/uL (ref 150–400)
RBC: 4.48 MIL/uL (ref 4.22–5.81)
RDW: 13 % (ref 11.5–15.5)
WBC: 10.2 10*3/uL (ref 4.0–10.5)

## 2016-01-14 LAB — BASIC METABOLIC PANEL
Anion gap: 6 (ref 5–15)
BUN: 14 mg/dL (ref 6–20)
CO2: 24 mmol/L (ref 22–32)
Calcium: 9.2 mg/dL (ref 8.9–10.3)
Chloride: 108 mmol/L (ref 101–111)
Creatinine, Ser: 1.07 mg/dL (ref 0.61–1.24)
GFR calc Af Amer: >60 mL/min (ref >60)
Glucose, Bld: 101 mg/dL — ABNORMAL HIGH (ref 65–99)
POTASSIUM: 3.8 mmol/L (ref 3.5–5.1)
SODIUM: 138 mmol/L (ref 135–145)

## 2016-01-14 MED ORDER — ATORVASTATIN CALCIUM 80 MG PO TABS: 80.0000 mg | ORAL_TABLET | Freq: Every day | ORAL | Status: DC

## 2016-01-14 MED ORDER — METOPROLOL TARTRATE 25 MG PO TABS: 25.0000 mg | ORAL_TABLET | Freq: Two times a day (BID) | ORAL | Status: DC

## 2016-01-14 MED ORDER — ASPIRIN 81 MG PO CHEW: 81.0000 mg | CHEWABLE_TABLET | Freq: Every day | ORAL | Status: DC

## 2016-01-14 MED ORDER — NITROGLYCERIN 0.4 MG SL SUBL: 0.4000 mg | SUBLINGUAL_TABLET | SUBLINGUAL | Status: DC | PRN

## 2016-01-14 MED ORDER — TICAGRELOR 90 MG PO TABS: 90.0000 mg | ORAL_TABLET | Freq: Two times a day (BID) | ORAL | Status: DC

## 2016-01-14 NOTE — Progress Notes (Signed)
Patient in stable condition, this RN went over discharge instructions with patient and son at bedside, they verbalised understanding, paper prescriptions given to patient patient belongings at bedside, iv taken out, tele dc ccmd notified, patient taken off the unit on a wheelcahir

## 2016-01-14 NOTE — Discharge Summary (Signed)
Discharge Summary    Patient ID: Bobby Price,  MRN: [id number], DOB/AGE: Oct 06, 1945 70 y.o.  Admit date: 01/11/2016 Discharge date: 01/14/2016  Primary Care Provider: No primary care provider on file. Primary Cardiologist: Havery MorosNew (McAlhany)  Discharge Diagnoses    Active Problems:   NSTEMI (non-ST elevated myocardial infarction) (HCC)   HTN   HLD   Allergies No Known Allergies  Diagnostic Studies/Procedures    Left Heart Cath and Coronary Angiography   01/11/16    Conclusion     Mid RCA lesion, 50% stenosed.  Ost Cx to Prox Cx lesion, 50% stenosed.  LM-1 lesion, 50% stenosed.  LM-2 lesion, 80% stenosed.  Prox Cx lesion, 70% stenosed.  Ost 2nd Mrg to 2nd Mrg lesion, 70% stenosed.  Prox LAD to Mid LAD lesion, 20% stenosed.  Dist LAD-1 lesion, 50% stenosed.  Dist LAD-2 lesion, 20% stenosed.  Dist RCA lesion, 100% stenosed. Post intervention, there is a 0% residual stenosis.  The left ventricular systolic function is normal.  1. Acute MI/NSTEMI secondary to occluded distal RCA 2. Successful PTCA/bare metal stent placement x 1 distal RCA  3. There is complex disease involving the distal left main (50%), ostial LAD (80%) and ostial Circumflex (50%). The mid RCA has moderate stenosis. The proximal Circumflex and OM1 has moderately severe stenosis.  4. Overall preserved LV systolic function  Recommendations: His acute event was the occluded distal RCA. I treated this with a bare metal stent given the disease in the left main, LAD and Circumflex. I think that these vessels will be best treated with CABG as it would be hard to avoid plaque shift into the Circumflex if the LAD were treated with a stent. There is moderate stenosis in the distal left main as well. Will treat with ASA, Brilinta, statin and beta blocker. Echo in am. Will review films with the interventional team Monday and if there is agreement, will get CT surgery involved Monday to begin planning for  CABG (bypass of the LAD,Circumflex) in one month (One month of ASA and Brilinta post MI and bare metal stenting).    Echo 01/12/16 LV EF: 60% - 65%  ------------------------------------------------------------------- Indications: CAD of native vessels 414.01. MI - acute 410.91.  ------------------------------------------------------------------- History: Risk factors: Former tobacco use. Hypertension.  ------------------------------------------------------------------- Study Conclusions  - Procedure narrative: Transthoracic echocardiography. Technically  difficult study with reduced echocardiographic windows and poor  endocardial definition. Intravenous contrast (Definity) was  administered. - Left ventricle: The cavity size was normal. There was moderate  concentric hypertrophy. Systolic function was normal. The  estimated ejection fraction was in the range of 60% to 65%.  Definity contrast images demonstrate normal wall motion. No LV  thrombus was noted. The study is not technically sufficient to  allow evaluation of LV diastolic function. - Mitral valve: Mildly thickened leaflets . There was trivial  regurgitation. - Left atrium: The atrium was normal in size. - Inferior vena cava: The vessel was dilated. The respirophasic  diameter changes were blunted (< 50%), consistent with elevated  central venous pressure.  Impressions:  - Technically difficult study. Contrast was given. Wall motion  appears normal. LVEF 60-65%. History of Present Illness     70 yo male with history of HTN, HLD who presented to the ED 01/11/16 with c/o chest pain x 8 hours associated with nausea. EKG with 1 mm ST elevation lead III. No reciprocal changes noted. Pt with ongoing pain. Cath lab activated for urgent cath. Pt denies prior heart disease  and states that he has never been in a hospital. He used to be on BP and cholesterol medications but has not been taking. Smoked 4ppd  but quit in 1976.   Hospital Course     Consultants: CTCA  1. CAD/NSTEMI:Pt admitted 01/11/16 with chest pain, NSTEMI. The distal RCA was occluded and treated with a bare metal stent. He has high grade ostial LAD disease with moderate disease in the distal left main and ostial Circumflex. His anatomy is not favorable for PCI/stenting of the LAD given the distal left main stenosis and ostial Circumflex disease. This would best be treated with bypass of the LAD and Circumflex. The patient was seen by Dr. Donata ClayVan Trigt and recommended outpatient f/u to discuss multi vessel CABG after his Flonnie OvermanBrillinta can be safely stopped. Continue ASA and Brilinta along with statin and beta blocker for at least one month. Since a bare metal stent was placed in the RCA, we could stop Brilinta in one month for CABG.  He will need follow up in our office in one week. He will need follow up in CT surgery office with Dr. Donata ClayVan Trigt in 4 weeks to discuss CABG. ( forward this note to [employer] ).   2. HTN: BP controlled. Continue beta blocker.   The patient has been seen by Dr. Clifton JamesMcAlhany today and deemed ready for discharge home. All follow-up appointments have been scheduled. Discharge medications are listed below.    Discharge Vitals Blood pressure 118/75, pulse 64, temperature 98.2 F (36.8 C), temperature source Oral, resp. rate 18, height 6\' 2"  (1.88 m), weight 238 lb 8.6 oz (108.2 kg), SpO2 98 %.  Filed Weights   01/11/16 1802 01/11/16 2100  Weight: 245 lb (111.131 kg) 238 lb 8.6 oz (108.2 kg)    Labs & Radiologic Studies     CBC  Recent Labs  01/11/16 1807 01/12/16 0224 01/14/16 0259  WBC 13.8* 11.9* 10.2  NEUTROABS 11.2*  --   --   HGB 14.6 12.3* 13.3  HCT 42.8 37.3* 40.8  MCV 89.7 89.9 91.1  PLT 194 159 177   Basic Metabolic Panel  Recent Labs  01/12/16 0224 01/14/16 0259  NA 135 138  K 3.6 3.8  CL 105 108  CO2 22 24  GLUCOSE 118* 101*  BUN 11 14  CREATININE 1.05 1.07  CALCIUM 9.3 9.2    Liver Function Tests No results for input(s): AST, ALT, ALKPHOS, BILITOT, PROT, ALBUMIN in the last 72 hours. No results for input(s): LIPASE, AMYLASE in the last 72 hours. Cardiac Enzymes  Recent Labs  01/11/16 2059 01/12/16 0224 01/12/16 0827  TROPONINI 14.62* 38.63* 41.98*   BNP Invalid input(s): POCBNP D-Dimer No results for input(s): DDIMER in the last 72 hours. Hemoglobin A1C No results for input(s): HGBA1C in the last 72 hours. Fasting Lipid Panel  Recent Labs  01/11/16 1807  CHOL 228*  HDL 48  LDLCALC 157*  TRIG 116  CHOLHDL 4.8   Thyroid Function Tests No results for input(s): TSH, T4TOTAL, T3FREE, THYROIDAB in the last 72 hours.  Invalid input(s): FREET3  Dg Chest Port 1 View  01/11/2016  CLINICAL DATA:  Chest pain EXAM: PORTABLE CHEST 1 VIEW COMPARISON:  None. FINDINGS: The heart size and mediastinal contours are within normal limits. Both lungs are clear. The visualized skeletal structures are unremarkable. IMPRESSION: No active disease. Electronically Signed   By: Marlan Palauharles  Clark M.D.   On: 01/11/2016 18:51    Disposition   Pt is being discharged home  today in good condition.  Follow-up Plans & Appointments    Follow-up Information    Follow up with [facility].   Specialty:  Cardiology   Why:  office will call with appointment time and date   Contact information:   [address] [place] [address] [phone]     Discharge Instructions    Amb Referral to Cardiac Rehabilitation    Complete by:  As directed   Diagnosis:   Coronary Stents NSTEMI       Diet - low sodium heart healthy    Complete by:  As directed      Discharge instructions    Complete by:  As directed   NO HEAVY LIFTING (>10lbs) X 2 WEEKS. NO SEXUAL ACTIVITY X 2 WEEKS. NO DRIVING X 1 WEEK. NO SOAKING BATHS, HOT TUBS, POOLS, ETC., X 7 DAYS.  Return to work after seen in clinic next week.     Increase activity slowly     Complete by:  As directed            Discharge Medications   Current Discharge Medication List    START taking these medications   Details  aspirin 81 MG chewable tablet Chew 1 tablet (81 mg total) by mouth daily.    atorvastatin (LIPITOR) 80 MG tablet Take 1 tablet (80 mg total) by mouth daily at 6 PM. Qty: 30 tablet, Refills: 6    metoprolol tartrate (LOPRESSOR) 25 MG tablet Take 1 tablet (25 mg total) by mouth 2 (two) times daily. Qty: 60 tablet, Refills: 6    nitroGLYCERIN (NITROSTAT) 0.4 MG SL tablet Place 1 tablet (0.4 mg total) under the tongue every 5 (five) minutes as needed for chest pain. Qty: 25 tablet, Refills: 12    ticagrelor (BRILINTA) 90 MG TABS tablet Take 1 tablet (90 mg total) by mouth 2 (two) times daily. Qty: 60 tablet, Refills: 6      CONTINUE these medications which have NOT CHANGED   Details  Travoprost, BAK Free, (TRAVATAN) 0.004 % SOLN ophthalmic solution Place 1 drop into both eyes daily as needed (spots in front of eyes).         Aspirin prescribed at discharge?  Yes High Intensity Statin Prescribed? (Lipitor 40-80mg  or Crestor 20-40mg ): Yes Beta Blocker Prescribed? Yes For EF 45% or less, Was ACEI/ARB Prescribed? Normal LV function ADP Receptor Inhibitor Prescribed? (i.e. Plavix etc.-Includes Medically Managed Patients): Yes For EF <40%, Aldosterone Inhibitor Prescribed? N/A Was EF assessed during THIS hospitalization? Yes Was Cardiac Rehab II ordered? (Included Medically managed Patients): Yes   Outstanding Labs/Studies   Lipid panel and LFT in 6 weeks  Duration of Discharge Encounter   Greater than 30 minutes including physician time.  Signed, Llewyn Heap PA-C 01/14/2016, 9:33 AM

## 2016-01-14 NOTE — Progress Notes (Signed)
     SUBJECTIVE: No chest pain or SOB.   Tele: sinus brady  BP 118/75 mmHg  Pulse 64  Temp(Src) 98.2 F (36.8 C) (Oral)  Resp 18  Ht 6\' 2"  (1.88 m)  Wt 238 lb 8.6 oz (108.2 kg)  BMI 30.61 kg/m2  SpO2 98%  Intake/Output Summary (Last 24 hours) at 01/14/16 0734 Last data filed at 01/13/16 2030  Gross per 24 hour  Intake    720 ml  Output      0 ml  Net    720 ml    PHYSICAL EXAM General: Well developed, well nourished, in no acute distress. Alert and oriented x 3.  Psych:  Good affect, responds appropriately Neck: No JVD. No masses noted.  Lungs: Clear bilaterally with no wheezes or rhonci noted.  Heart: RRR with no murmurs noted. Abdomen: Bowel sounds are present. Soft, non-tender.  Extremities: No lower extremity edema.   LABS: Basic Metabolic Panel:  Recent Labs  16/04/9606/02/17 0224 01/14/16 0259  NA 135 138  K 3.6 3.8  CL 105 108  CO2 22 24  GLUCOSE 118* 101*  BUN 11 14  CREATININE 1.05 1.07  CALCIUM 9.3 9.2   CBC:  Recent Labs  01/11/16 1807 01/12/16 0224 01/14/16 0259  WBC 13.8* 11.9* 10.2  NEUTROABS 11.2*  --   --   HGB 14.6 12.3* 13.3  HCT 42.8 37.3* 40.8  MCV 89.7 89.9 91.1  PLT 194 159 177   Cardiac Enzymes:  Recent Labs  01/11/16 2059 01/12/16 0224 01/12/16 0827  TROPONINI 14.62* 38.63* 41.98*   Fasting Lipid Panel:  Recent Labs  01/11/16 1807  CHOL 228*  HDL 48  LDLCALC 157*  TRIG 116  CHOLHDL 4.8    Current Meds: . aspirin  81 mg Oral Daily  . atorvastatin  80 mg Oral q1800  . metoprolol tartrate  25 mg Oral BID  . sodium chloride flush  3 mL Intravenous Q12H  . ticagrelor  90 mg Oral BID    ASSESSMENT AND PLAN:  1. CAD/NSTEMI:Pt admitted 01/11/16 with chest pain, NSTEMI. The distal RCA was occluded and treated with a bare metal stent. He has high grade ostial LAD disease with moderate disease in the distal left main and ostial Circumflex. His anatomy is not favorable for PCI/stenting of the LAD given the distal left  main stenosis and ostial Circumflex disease. I think this would best be treated with bypass of the LAD and Circumflex.  I will continue ASA and Brilinta along with statin and beta blocker for at least one month. Since a bare metal stent was placed in the RCA, we could stop Brilinta in one month for CABG. He is doing well this am. Will d/c home today. He will need follow up in our office in one week. He will need follow up in CT surgery office with Dr. Donata ClayVan Trigt in 4 weeks to discuss CABG. (I will forward this note to [employer]).   2. HTN: BP controlled. Continue beta blocker.    Verne Carrowhristopher McAlhany  7/4/20177:34 AM

## 2016-01-15 ENCOUNTER — Encounter (HOSPITAL_COMMUNITY): Payer: Medicare Other

## 2016-02-12 ENCOUNTER — Ambulatory Visit (INDEPENDENT_AMBULATORY_CARE_PROVIDER_SITE_OTHER): Payer: Self-pay | Admitting: Cardiothoracic Surgery

## 2016-02-12 ENCOUNTER — Encounter: Payer: Self-pay | Admitting: Cardiothoracic Surgery

## 2016-02-12 VITALS — BP 128/79 | HR 67 | Resp 20 | Ht 74.0 in | Wt 235.0 lb

## 2016-02-12 DIAGNOSIS — I2510 Atherosclerotic heart disease of native coronary artery without angina pectoris: Secondary | ICD-10-CM

## 2016-02-12 DIAGNOSIS — I213 ST elevation (STEMI) myocardial infarction of unspecified site: Secondary | ICD-10-CM

## 2016-02-12 DIAGNOSIS — I25110 Atherosclerotic heart disease of native coronary artery with unstable angina pectoris: Secondary | ICD-10-CM

## 2016-02-12 NOTE — Progress Notes (Signed)
[address]       [place] [address]             [phone]        Bassil Bhuiyan [facility] Medical Record #[id number] Date of Birth: 1946/03/08  Referring: [name]* Primary Care: PROVIDER NOT IN SYSTEM  Chief Complaint:    Chief Complaint  Patient presents with  . Coronary Artery Disease    f/u from hospital consult, further discuss CABG  Acute DMI with residual left main and multivessel CAD  History of Present Illness:     Patient is a 70 year old Caucasian male nondiabetic reformed smoker who presented in early July with acute chest pain and positive cardiac enzymes and underwent urgent catheterization. This demonstrated a totally occluded RCA with significant left main and multivessel CAD as well. The patient underwent a bare-metal stent to reperfuse the RCA with good result. The plan was to allow the patient to recover from the RCA occlusion-MI and proceed with multivessel CABG after Brilinta could be safely stopped one month after PCI.  The patient has done very well since his PCI. No recurrent chest pain or symptoms of CHF. His activity level has been reduced. He recently developed sinus congestion and a cough which is now improving. No bleeding complications noted from the Brilinta.  The patient has a fairly benign medical history without previous hospitalizations or major surgery. He is right-hand dominant and denies allergies. No history of thoracic trauma. The patient did have a large soft tissue incision by his right knee as a child in the area of the greater saphenous vein. This required sutures in the emergency room.  While the patient was hospitalized he had echocardiogram which showed normal LV function no significant valvular abnormalities.   Current Activity/ Functional Status: The patient is currently retired from working at it tobacco plant. He takes care of his disabled wife who is on 5 L oxygen in a wheelchair.   Zubrod  Score: At the time of surgery this patient's most appropriate activity status/level should be described as: []     0    Normal activity, no symptoms [x]     1    Restricted in physical strenuous activity but ambulatory, able to do out light work []     2    Ambulatory and capable of self care, unable to do work activities, up and about                 more than 50%  Of the time                            []     3    Only limited self care, in bed greater than 50% of waking hours []     4    Completely disabled, no self care, confined to bed or chair []     5    Moribund  Past Medical History:  Diagnosis Date  . Hyperlipidemia   . Hypertension     Past Surgical History:  Procedure Laterality Date  . CARDIAC CATHETERIZATION N/A 01/11/2016   Procedure: Left Heart Cath and Coronary Angiography;  Surgeon: [name], MD;  Location: [facility] INVASIVE CV LAB;  Service: Cardiovascular;  Laterality: N/A;  . None      History  Smoking Status  . Former Smoker  . Packs/day: 4.00  . Years: 10.00  . Types: Cigarettes  Smokeless Tobacco  .  Not on file   History  Alcohol Use  . 0.0 oz/week    Comment: He drinks up to 6 beers per day    Social History   Social History  . Marital status: Unknown    Spouse name: N/A  . Number of children: N/A  . Years of education: N/A   Occupational History  . Not on file.   Social History Main Topics  . Smoking status: Former Smoker    Packs/day: 4.00    Years: 10.00    Types: Cigarettes  . Smokeless tobacco: Not on file  . Alcohol use 0.0 oz/week     Comment: He drinks up to 6 beers per day  . Drug use: No  . Sexual activity: Not on file   Other Topics Concern  . Not on file   Social History Narrative  . No narrative on file    No Known Allergies  Current Outpatient Prescriptions  Medication Sig Dispense Refill  . aspirin 81 MG chewable tablet Chew 1 tablet (81 mg total) by mouth daily.    Marland Kitchen atorvastatin (LIPITOR) 80 MG tablet Take  1 tablet (80 mg total) by mouth daily at 6 PM. 30 tablet 6  . metoprolol tartrate (LOPRESSOR) 25 MG tablet Take 1 tablet (25 mg total) by mouth 2 (two) times daily. 60 tablet 6  . nitroGLYCERIN (NITROSTAT) 0.4 MG SL tablet Place 1 tablet (0.4 mg total) under the tongue every 5 (five) minutes as needed for chest pain. 25 tablet 12  . ticagrelor (BRILINTA) 90 MG TABS tablet Take 1 tablet (90 mg total) by mouth 2 (two) times daily. 60 tablet 6  . Travoprost, BAK Free, (TRAVATAN) 0.004 % SOLN ophthalmic solution Place 1 drop into both eyes daily as needed (spots in front of eyes).     No current facility-administered medications for this visit.      (Not in a hospital admission)  Family History  Problem Relation Age of Onset  . Heart attack Maternal Grandfather      Review of Systems:       Cardiac Review of Systems: Y or N  Chest Pain [  y  ]  Resting SOB [ n  ] Exertional SOB  [n  ]  Orthopnea [ n ]   Pedal Edema [ n  ]    Palpitations [ n ] Syncope  [n  ]   Presyncope [ n  ]  General Review of Systems: [Y] = yes [  ]=no Constitional: recent weight change [yIncreased  ]; anorexia [  ]; fatigue [  ]; nausea [  ]; night sweats [  ]; fever [  ]; or chills [  ]                                                               Dental: poor dentition[  ]; Last Dentist visit: 6 months  Eye : blurred vision [  ]; diplopia [   ]; vision changes [  ];  Amaurosis fugax[  ]; Resp: cough [ yes with nasal and sinus congestion ];  wheezing[  ];  hemoptysis[  ]; shortness of breath[  ]; paroxysmal nocturnal dyspnea[  ]; dyspnea on exertion[  ]; or orthopnea[  ];  GI:  gallstones[  ],  vomiting[  ];  dysphagia[  ]; melena[  ];  hematochezia [  ]; heartburn[  ];   Hx of  Colonoscopy[ yes at age 58 was normal  ]; GU: kidney stones [  ]; hematuria[  ];   dysuria [  ];  nocturia[ yes 3 times per night ];  history of     obstruction [  ]; urinary frequency [ yes nocturia ]             Skin: rash, swelling[  ];,  hair loss[  ];  peripheral edema[  ];  or itching[  ]; Musculosketetal: myalgias[  ];  joint swelling[  ];  joint erythema[  ];  joint pain[  ];  back pain[  ];  Heme/Lymph: bruising[  ];  bleeding[  ];  anemia[  ];  Neuro: TIA[  ];  headaches[  ];  stroke[  ];  vertigo[  ];  seizures[  ];   paresthesias[  ];  difficulty walking[  ];  Psych:depression[  ]; anxiety[  ];  Endocrine: diabetes[  ];  thyroid dysfunction[  ];  Immunizations: Flu [  ]; Pneumococcal[  ];  Other: No exposure  To general anesthesia, no known bleeding disorder  Physical Exam: BP 128/79 (BP Location: Right Arm, Patient Position: Sitting, Cuff Size: Normal)   Pulse 67   Resp 20   Ht 6\' 2"  (1.88 m)   Wt 235 lb (106.6 kg)   SpO2 98% Comment: RA  BMI 30.17 kg/m        Physical Exam  General: Well-nourished 70 year old Caucasian male no acute distress HEENT: Normocephalic pupils equal , dentition adequate. Mild nasal congestion Neck: Supple without JVD, adenopathy, or bruit Chest: Clear to auscultation, symmetrical breath sounds, no rhonchi, no tenderness             or deformity Cardiovascular: Regular rate and rhythm, no murmur, no gallop, peripheral pulses             palpable in all extremities Abdomen:  Soft, nontender, no palpable mass or organomegaly Extremities: Warm, well-perfused, no clubbing cyanosis edema or tenderness,              no venous stasis changes of the legs. Scar over right knee in region of greater saphenous vein Rectal/GU: Deferred Neuro: Grossly non--focal and symmetrical throughout Skin: Clean and dry without rash or ulceration   Diagnostic Studies & Laboratory data:     Recent Radiology Findings:   No results found.   I have independently reviewed the above radiologic studies.  Recent Lab Findings: Lab Results  Component Value Date   WBC 10.2 01/14/2016   HGB 13.3 01/14/2016   HCT 40.8 01/14/2016   PLT 177 01/14/2016   GLUCOSE 101 (H) 01/14/2016   CHOL 228 (H)  01/11/2016   TRIG 116 01/11/2016   HDL 48 01/11/2016   LDLCALC 157 (H) 01/11/2016   NA 138 01/14/2016   K 3.8 01/14/2016   CL 108 01/14/2016   CREATININE 1.07 01/14/2016   BUN 14 01/14/2016   CO2 24 01/14/2016   INR 1.03 01/11/2016      Assessment / Plan:     80% stenosis of the left main proximal LAD, 75% stenosis of the OM1 and OM 2 Status post PCI of RCA [bare metal stent ]with some residual distal stenosis prior to the PDA Preserved LV function Post PCI Brilinta, plans to proceed with surgical coronary revascularization after 1 month when Brilinta can be safely stopped  Patient  will be scheduled for surgical coronary revascularization on August 15 at [facility] hospital. He will stop the Brilinta 7 days prior to surgery. We will plan on bypass grafts to the LAD OM1 and OM 2 and PDA. Procedure indications benefits and risks fully discussed in detail with patient who understands and agrees to proceed with surgery.   @ 02/12/2016 5:54 PM

## 2016-02-13 ENCOUNTER — Other Ambulatory Visit: Payer: Self-pay

## 2016-02-13 DIAGNOSIS — I2510 Atherosclerotic heart disease of native coronary artery without angina pectoris: Secondary | ICD-10-CM

## 2016-02-13 DIAGNOSIS — I25111 Atherosclerotic heart disease of native coronary artery with angina pectoris with documented spasm: Secondary | ICD-10-CM

## 2016-02-20 ENCOUNTER — Encounter (HOSPITAL_COMMUNITY): Payer: Self-pay

## 2016-02-20 NOTE — Pre-Procedure Instructions (Signed)
Ramesh Moan  02/20/2016      [id number] - [place], [place] - [address] AT [facility] [address] [place] [address] Phone: [phone] Fax: [phone]    Your procedure is scheduled on Tuesday August 15.  Report to [facility] at 5:30 A.M.   Call this number if you have problems the morning of surgery:  [phone]   Remember:  Do not eat food or drink liquids after midnight.  Take these medicines the morning of surgery with A SIP OF WATER: metoprolol (lopressor), nitroglycerin if needed  7 days prior to surgery STOP taking any Aspirin, Aleve, Naproxen, Ibuprofen, Motrin, Advil, Goody's, BC's, all herbal medications, fish oil, and all vitamins   Do not wear jewelry.  Do not wear lotions, powders, or colognes.  You may NOT wear deoderant.  Men may shave face and neck.  Do not bring valuables to the hospital.  [facility] is not responsible for any belongings or valuables.  Contacts, dentures or bridgework may not be worn into surgery.  Leave your suitcase in the car.  After surgery it may be brought to your room.  For patients admitted to the hospital, discharge time will be determined by your treatment team.  Patients discharged the day of surgery will not be allowed to drive home.    [facility]- Preparing For Surgery  Before surgery, you can play an important role. Because skin is not sterile, your skin needs to be as free of germs as possible. You can reduce the number of germs on your skin by washing with CHG (chlorahexidine gluconate) Soap before surgery.  CHG is an antiseptic cleaner which kills germs and bonds with the skin to continue killing germs even after washing.  Please do not use if you have an allergy to CHG or antibacterial soaps. If your skin becomes reddened/irritated stop using the CHG.  Do not shave (including legs and underarms) for at least 48 hours prior to first CHG shower. It is OK to shave  your face.  Please follow these instructions carefully.   1. Shower the NIGHT BEFORE SURGERY and the MORNING OF SURGERY with CHG.   2. If you chose to wash your hair, wash your hair first as usual with your normal shampoo.  3. After you shampoo, rinse your hair and body thoroughly to remove the shampoo.  4. Use CHG as you would any other liquid soap. You can apply CHG directly to the skin and wash gently with a scrungie or a clean washcloth.   5. Apply the CHG Soap to your body ONLY FROM THE NECK DOWN.  Do not use on open wounds or open sores. Avoid contact with your eyes, ears, mouth and genitals (private parts). Wash genitals (private parts) with your normal soap.  6. Wash thoroughly, paying special attention to the area where your surgery will be performed.  7. Thoroughly rinse your body with warm water from the neck down.  8. DO NOT shower/wash with your normal soap after using and rinsing off the CHG Soap.  9. Pat yourself dry with a CLEAN TOWEL.   10. Wear CLEAN PAJAMAS   11. Place CLEAN SHEETS on your bed the night of your first shower and DO NOT SLEEP WITH PETS.   Day of Surgery: Do not apply any deodorants/lotions. Please wear clean clothes to the hospital/surgery center.    Please read over the following fact sheets that you  were given. MRSA Information

## 2016-02-21 ENCOUNTER — Ambulatory Visit (HOSPITAL_COMMUNITY)
Admission: RE | Admit: 2016-02-21 | Discharge: 2016-02-21 | Disposition: A | Discharge status: Home / Self Care | Payer: Medicare Other | Source: Ambulatory Visit | Attending: Cardiothoracic Surgery | Admitting: Cardiothoracic Surgery

## 2016-02-21 ENCOUNTER — Encounter (HOSPITAL_COMMUNITY): Payer: Self-pay

## 2016-02-21 ENCOUNTER — Encounter (HOSPITAL_COMMUNITY)
Admission: RE | Admit: 2016-02-21 | Discharge: 2016-02-21 | Disposition: A | Discharge status: Home / Self Care | Payer: Medicare Other | Source: Ambulatory Visit | Attending: Cardiothoracic Surgery | Admitting: Cardiothoracic Surgery

## 2016-02-21 ENCOUNTER — Ambulatory Visit (HOSPITAL_BASED_OUTPATIENT_CLINIC_OR_DEPARTMENT_OTHER)
Admission: RE | Admit: 2016-02-21 | Discharge: 2016-02-21 | Disposition: A | Discharge status: Home / Self Care | Payer: Self-pay | Source: Ambulatory Visit | Attending: Cardiothoracic Surgery | Admitting: Cardiothoracic Surgery

## 2016-02-21 DIAGNOSIS — Z79899 Other long term (current) drug therapy: Secondary | ICD-10-CM | POA: Diagnosis not present

## 2016-02-21 DIAGNOSIS — I6523 Occlusion and stenosis of bilateral carotid arteries: Secondary | ICD-10-CM | POA: Insufficient documentation

## 2016-02-21 DIAGNOSIS — Z7982 Long term (current) use of aspirin: Secondary | ICD-10-CM | POA: Insufficient documentation

## 2016-02-21 DIAGNOSIS — I25111 Atherosclerotic heart disease of native coronary artery with angina pectoris with documented spasm: Secondary | ICD-10-CM

## 2016-02-21 DIAGNOSIS — R9431 Abnormal electrocardiogram [ECG] [EKG]: Secondary | ICD-10-CM | POA: Insufficient documentation

## 2016-02-21 DIAGNOSIS — Z0183 Encounter for blood typing: Secondary | ICD-10-CM | POA: Diagnosis not present

## 2016-02-21 DIAGNOSIS — I2510 Atherosclerotic heart disease of native coronary artery without angina pectoris: Secondary | ICD-10-CM

## 2016-02-21 DIAGNOSIS — Z7902 Long term (current) use of antithrombotics/antiplatelets: Secondary | ICD-10-CM | POA: Insufficient documentation

## 2016-02-21 DIAGNOSIS — Z01812 Encounter for preprocedural laboratory examination: Secondary | ICD-10-CM | POA: Diagnosis not present

## 2016-02-21 DIAGNOSIS — Z01818 Encounter for other preprocedural examination: Secondary | ICD-10-CM | POA: Diagnosis not present

## 2016-02-21 DIAGNOSIS — Z87891 Personal history of nicotine dependence: Secondary | ICD-10-CM | POA: Diagnosis not present

## 2016-02-21 HISTORY — DX: Acute myocardial infarction, unspecified: I21.9

## 2016-02-21 HISTORY — DX: Unspecified asthma, uncomplicated: J45.909

## 2016-02-21 HISTORY — DX: Nocturia: R35.1

## 2016-02-21 HISTORY — DX: Presence of spectacles and contact lenses: Z97.3

## 2016-02-21 LAB — PROTIME-INR
INR: 1.03
Prothrombin Time: 13.5 seconds (ref 11.4–15.2)

## 2016-02-21 LAB — CBC
HCT: 43.7 % (ref 39.0–52.0)
Hemoglobin: 14.6 g/dL (ref 13.0–17.0)
MCH: 30.2 pg (ref 26.0–34.0)
MCHC: 33.4 g/dL (ref 30.0–36.0)
MCV: 90.5 fL (ref 78.0–100.0)
Platelets: 179 10*3/uL (ref 150–400)
RBC: 4.83 MIL/uL (ref 4.22–5.81)
RDW: 13.1 % (ref 11.5–15.5)
WBC: 8.7 10*3/uL (ref 4.0–10.5)

## 2016-02-21 LAB — PULMONARY FUNCTION TEST
DL/VA % pred: 105 %
DL/VA: 5.1 ml/min/mmHg/L
DLCO unc % pred: 97 %
DLCO unc: 36.93 ml/min/mmHg
FEF 25-75 Post: 3.19 L/sec
FEF 25-75 Pre: 2.57 L/sec
FEF2575-%Change-Post: 24 %
FEF2575-%Pred-Post: 106 %
FEF2575-%Pred-Pre: 86 %
FEV1-%Change-Post: 5 %
FEV1-%Pred-Post: 98 %
FEV1-%Pred-Pre: 93 %
FEV1-Post: 3.83 L
FEV1-Pre: 3.62 L
FEV1FVC-%Change-Post: 6 %
FEV1FVC-%Pred-Pre: 98 %
FEV6-%Change-Post: 0 %
FEV6-%Pred-Post: 98 %
FEV6-%Pred-Pre: 98 %
FEV6-Post: 4.86 L
FEV6-Pre: 4.86 L
FEV6FVC-%Change-Post: 1 %
FEV6FVC-%Pred-Post: 104 %
FEV6FVC-%Pred-Pre: 102 %
FVC-%Change-Post: -1 %
FVC-%Pred-Post: 94 %
FVC-%Pred-Pre: 95 %
FVC-Post: 4.91 L
FVC-Pre: 4.96 L
Post FEV1/FVC ratio: 78 %
Post FEV6/FVC ratio: 99 %
Pre FEV1/FVC ratio: 73 %
Pre FEV6/FVC Ratio: 98 %
RV % pred: 86 %
RV: 2.27 L
TLC % pred: 95 %
TLC: 7.51 L

## 2016-02-21 LAB — URINE MICROSCOPIC-ADD ON
Bacteria, UA: NONE SEEN
Squamous Epithelial / LPF: NONE SEEN

## 2016-02-21 LAB — VAS US DOPPLER PRE CABG
LEFT ECA DIAS: -14 cm/s
LEFT VERTEBRAL DIAS: -19 cm/s
Left CCA dist dias: 28 cm/s
Left CCA dist sys: 93 cm/s
Left CCA prox dias: 25 cm/s
Left CCA prox sys: 107 cm/s
Left ICA dist dias: -30 cm/s
Left ICA dist sys: -78 cm/s
Left ICA prox dias: -24 cm/s
Left ICA prox sys: -56 cm/s
RIGHT ECA DIAS: -20 cm/s
RIGHT VERTEBRAL DIAS: 9 cm/s
Right CCA prox dias: 25 cm/s
Right CCA prox sys: 120 cm/s
Right cca dist sys: -53 cm/s

## 2016-02-21 LAB — COMPREHENSIVE METABOLIC PANEL
ALT: 33 U/L (ref 17–63)
AST: 31 U/L (ref 15–41)
Albumin: 4 g/dL (ref 3.5–5.0)
Alkaline Phosphatase: 68 U/L (ref 38–126)
Anion gap: 10 (ref 5–15)
BUN: 14 mg/dL (ref 6–20)
CO2: 20 mmol/L — ABNORMAL LOW (ref 22–32)
Calcium: 10 mg/dL (ref 8.9–10.3)
Chloride: 106 mmol/L (ref 101–111)
Creatinine, Ser: 1.08 mg/dL (ref 0.61–1.24)
GFR calc Af Amer: >60 mL/min (ref >60)
GFR calc non Af Amer: >60 mL/min (ref >60)
Glucose, Bld: 107 mg/dL — ABNORMAL HIGH (ref 65–99)
Potassium: 4.2 mmol/L (ref 3.5–5.1)
Sodium: 136 mmol/L (ref 135–145)
Total Bilirubin: 1 mg/dL (ref 0.3–1.2)
Total Protein: 7.6 g/dL (ref 6.5–8.1)

## 2016-02-21 LAB — URINALYSIS, ROUTINE W REFLEX MICROSCOPIC
Bilirubin Urine: NEGATIVE
Glucose, UA: NEGATIVE mg/dL
Ketones, ur: NEGATIVE mg/dL
Leukocytes, UA: NEGATIVE
Nitrite: NEGATIVE
Protein, ur: NEGATIVE mg/dL
Specific Gravity, Urine: 1.017 (ref 1.005–1.030)
pH: 5.5 (ref 5.0–8.0)

## 2016-02-21 LAB — BLOOD GAS, ARTERIAL
Acid-base deficit: 2.1 mmol/L — ABNORMAL HIGH (ref 0.0–2.0)
Bicarbonate: 21.6 mEq/L (ref 20.0–24.0)
Drawn by: 42180
FIO2: 21
O2 Saturation: 95.2 %
Patient temperature: 98.6
TCO2: 22.6 mmol/L (ref 0–100)
pCO2 arterial: 33.6 mmHg — ABNORMAL LOW (ref 35.0–45.0)
pH, Arterial: 7.425 (ref 7.350–7.450)
pO2, Arterial: 91.9 mmHg (ref 80.0–100.0)

## 2016-02-21 LAB — APTT: aPTT: 32 seconds (ref 24–36)

## 2016-02-21 LAB — ABO/RH: ABO/RH(D): B NEG

## 2016-02-21 LAB — SURGICAL PCR SCREEN
MRSA, PCR: NEGATIVE
Staphylococcus aureus: NEGATIVE

## 2016-02-21 LAB — TYPE AND SCREEN
ABO/RH(D): B NEG
Antibody Screen: NEGATIVE

## 2016-02-21 MED ORDER — ALBUTEROL SULFATE (2.5 MG/3ML) 0.083% IN NEBU
2.5000 mg | INHALATION_SOLUTION | Freq: Once | RESPIRATORY_TRACT | Status: AC
  Administered 2016-02-21: 2.5 mg via RESPIRATORY_TRACT

## 2016-02-21 NOTE — Progress Notes (Signed)
PCP - Pt. Denies having a PCP but states that if needed [facility] Cardiologist - pt. States that he has not established a cardiologist at this time  EKG - 02/21/16 CXR - 02/21/16 Echo - 01/12/16 Cardiac Cath - 01/11/16  Patient denies chest pain and shortness of breath at PAT appointment.    Patient states that he took his last dose of Brilinta and Aspirin on 02/19/16 at 0001.

## 2016-02-21 NOTE — Progress Notes (Addendum)
Pre-op Cardiac Surgery  Carotid Findings:  No evidence of a significant stenosis noted. Vertebral arteries demonstrated antegrade flow.  Upper Extremity Right Left  Brachial Pressures 124 125  Radial Waveforms Triphasic Triphasic  Ulnar Waveforms Triphasic Triphasic  Palmar Arch (Allen's Test) WNL Abnormal ulnar compression   Findings:      Lower  Extremity Right Left  Dorsalis Pedis Tiphasic Triphasic  Posterior Tibial Triphasic Triphasic  Ankle/Brachial Indices      Bobby Price, BS, RDMS, RVT

## 2016-02-22 LAB — HEMOGLOBIN A1C
Hgb A1c MFr Bld: 5.8 % — ABNORMAL HIGH (ref 4.8–5.6)
Mean Plasma Glucose: 120 mg/dL

## 2016-02-24 MED ORDER — VANCOMYCIN HCL 10 G IV SOLR
1500.0000 mg | INTRAVENOUS | Status: AC
  Administered 2016-02-25: 1500 mg via INTRAVENOUS
  Filled 2016-02-24: qty 1500

## 2016-02-24 MED ORDER — CHLORHEXIDINE GLUCONATE 0.12 % MT SOLN
15.0000 mL | Freq: Once | OROMUCOSAL | Status: AC
Start: 2016-02-25 — End: 2016-02-25
  Administered 2016-02-25: 15 mL via OROMUCOSAL
  Filled 2016-02-24: qty 15

## 2016-02-24 MED ORDER — SODIUM CHLORIDE 0.9 % IV SOLN
INTRAVENOUS | Status: DC
  Filled 2016-02-24: qty 30

## 2016-02-24 MED ORDER — PHENYLEPHRINE HCL 10 MG/ML IJ SOLN
30.0000 ug/min | INTRAVENOUS | Status: DC
  Filled 2016-02-24: qty 2

## 2016-02-24 MED ORDER — DEXTROSE 5 % IV SOLN
1.5000 g | INTRAVENOUS | Status: AC
  Administered 2016-02-25: 1.5 g via INTRAVENOUS
  Administered 2016-02-25: .75 g via INTRAVENOUS
  Filled 2016-02-24 (×2): qty 1.5

## 2016-02-24 MED ORDER — SODIUM CHLORIDE 0.9 % IV SOLN
INTRAVENOUS | Status: DC
  Filled 2016-02-24: qty 2.5

## 2016-02-24 MED ORDER — EPINEPHRINE HCL 1 MG/ML IJ SOLN
0.0000 ug/min | INTRAVENOUS | Status: DC
  Filled 2016-02-24: qty 4

## 2016-02-24 MED ORDER — MAGNESIUM SULFATE 50 % IJ SOLN
40.0000 meq | INTRAMUSCULAR | Status: DC
  Filled 2016-02-24: qty 10

## 2016-02-24 MED ORDER — DEXMEDETOMIDINE HCL IN NACL 400 MCG/100ML IV SOLN
0.1000 ug/kg/h | INTRAVENOUS | Status: DC
  Filled 2016-02-24: qty 100

## 2016-02-24 MED ORDER — METOPROLOL TARTRATE 12.5 MG HALF TABLET
12.5000 mg | ORAL_TABLET | Freq: Once | ORAL | Status: DC
Start: 2016-02-25 — End: 2016-02-25

## 2016-02-24 MED ORDER — DOPAMINE-DEXTROSE 3.2-5 MG/ML-% IV SOLN
0.0000 ug/kg/min | INTRAVENOUS | Status: DC
  Filled 2016-02-24 (×2): qty 250

## 2016-02-24 MED ORDER — PLASMA-LYTE 148 IV SOLN
INTRAVENOUS | Status: AC
  Administered 2016-02-25: 500 mL
  Filled 2016-02-24: qty 2.5

## 2016-02-24 MED ORDER — NITROGLYCERIN IN D5W 200-5 MCG/ML-% IV SOLN
2.0000 ug/min | INTRAVENOUS | Status: AC
  Administered 2016-02-25: 5 ug/min via INTRAVENOUS
  Filled 2016-02-24: qty 250

## 2016-02-24 MED ORDER — POTASSIUM CHLORIDE 2 MEQ/ML IV SOLN
80.0000 meq | INTRAVENOUS | Status: DC
  Filled 2016-02-24: qty 40

## 2016-02-24 MED ORDER — DEXTROSE 5 % IV SOLN
750.0000 mg | INTRAVENOUS | Status: DC
  Filled 2016-02-24: qty 750

## 2016-02-24 MED ORDER — SODIUM CHLORIDE 0.9 % IV SOLN
INTRAVENOUS | Status: DC
  Filled 2016-02-24: qty 40

## 2016-02-25 ENCOUNTER — Inpatient Hospital Stay (HOSPITAL_COMMUNITY): Payer: Medicare Other

## 2016-02-25 ENCOUNTER — Inpatient Hospital Stay (HOSPITAL_COMMUNITY)
Admission: RE | Admit: 2016-02-25 | Discharge: 2016-02-29 | DRG: 236 | Disposition: A | Discharge status: Home / Self Care | Payer: Medicare Other | Source: Ambulatory Visit | Attending: Cardiothoracic Surgery | Admitting: Cardiothoracic Surgery

## 2016-02-25 ENCOUNTER — Encounter (HOSPITAL_COMMUNITY)
Admission: RE | Disposition: A | Discharge status: Home / Self Care | Payer: Self-pay | Source: Ambulatory Visit | Attending: Cardiothoracic Surgery

## 2016-02-25 ENCOUNTER — Inpatient Hospital Stay (HOSPITAL_COMMUNITY): Payer: Medicare Other | Admitting: Certified Registered Nurse Anesthetist

## 2016-02-25 ENCOUNTER — Encounter (HOSPITAL_COMMUNITY): Payer: Self-pay | Admitting: Surgery

## 2016-02-25 DIAGNOSIS — E669 Obesity, unspecified: Secondary | ICD-10-CM | POA: Diagnosis not present

## 2016-02-25 DIAGNOSIS — I4510 Unspecified right bundle-branch block: Secondary | ICD-10-CM | POA: Diagnosis not present

## 2016-02-25 DIAGNOSIS — I252 Old myocardial infarction: Secondary | ICD-10-CM

## 2016-02-25 DIAGNOSIS — I10 Essential (primary) hypertension: Secondary | ICD-10-CM | POA: Diagnosis present

## 2016-02-25 DIAGNOSIS — Z951 Presence of aortocoronary bypass graft: Secondary | ICD-10-CM

## 2016-02-25 DIAGNOSIS — I25119 Atherosclerotic heart disease of native coronary artery with unspecified angina pectoris: Secondary | ICD-10-CM | POA: Diagnosis not present

## 2016-02-25 DIAGNOSIS — Z23 Encounter for immunization: Secondary | ICD-10-CM | POA: Diagnosis not present

## 2016-02-25 DIAGNOSIS — Z955 Presence of coronary angioplasty implant and graft: Secondary | ICD-10-CM

## 2016-02-25 DIAGNOSIS — I2510 Atherosclerotic heart disease of native coronary artery without angina pectoris: Secondary | ICD-10-CM

## 2016-02-25 DIAGNOSIS — I25110 Atherosclerotic heart disease of native coronary artery with unstable angina pectoris: Secondary | ICD-10-CM

## 2016-02-25 DIAGNOSIS — I454 Nonspecific intraventricular block: Secondary | ICD-10-CM | POA: Diagnosis present

## 2016-02-25 DIAGNOSIS — Z7982 Long term (current) use of aspirin: Secondary | ICD-10-CM

## 2016-02-25 DIAGNOSIS — E785 Hyperlipidemia, unspecified: Secondary | ICD-10-CM | POA: Diagnosis present

## 2016-02-25 DIAGNOSIS — Z87891 Personal history of nicotine dependence: Secondary | ICD-10-CM

## 2016-02-25 DIAGNOSIS — E8770 Fluid overload, unspecified: Secondary | ICD-10-CM | POA: Diagnosis not present

## 2016-02-25 DIAGNOSIS — Z6830 Body mass index (BMI) 30.0-30.9, adult: Secondary | ICD-10-CM | POA: Diagnosis not present

## 2016-02-25 DIAGNOSIS — Z7902 Long term (current) use of antithrombotics/antiplatelets: Secondary | ICD-10-CM | POA: Diagnosis not present

## 2016-02-25 DIAGNOSIS — D62 Acute posthemorrhagic anemia: Secondary | ICD-10-CM | POA: Diagnosis not present

## 2016-02-25 HISTORY — DX: Atherosclerotic heart disease of native coronary artery without angina pectoris: I25.10

## 2016-02-25 HISTORY — PX: INTRAOPERATIVE TRANSESOPHAGEAL ECHOCARDIOGRAM: SHX5062

## 2016-02-25 HISTORY — PX: CORONARY ARTERY BYPASS GRAFT: SHX141

## 2016-02-25 HISTORY — DX: Gastro-esophageal reflux disease without esophagitis: K21.9

## 2016-02-25 LAB — POCT I-STAT, CHEM 8
BUN: 14 mg/dL (ref 6–20)
BUN: 13 mg/dL (ref 6–20)
BUN: 14 mg/dL (ref 6–20)
BUN: 12 mg/dL (ref 6–20)
BUN: 12 mg/dL (ref 6–20)
BUN: 11 mg/dL (ref 6–20)
Calcium, Ion: 1.32 mmol/L — ABNORMAL HIGH (ref 1.12–1.23)
Calcium, Ion: 1.11 mmol/L — ABNORMAL LOW (ref 1.12–1.23)
Calcium, Ion: 1.31 mmol/L — ABNORMAL HIGH (ref 1.12–1.23)
Calcium, Ion: 1.08 mmol/L — ABNORMAL LOW (ref 1.12–1.23)
Calcium, Ion: 1.12 mmol/L (ref 1.12–1.23)
Calcium, Ion: 1.16 mmol/L (ref 1.12–1.23)
Chloride: 106 mmol/L (ref 101–111)
Chloride: 105 mmol/L (ref 101–111)
Chloride: 98 mmol/L — ABNORMAL LOW (ref 101–111)
Chloride: 103 mmol/L (ref 101–111)
Chloride: 101 mmol/L (ref 101–111)
Chloride: 103 mmol/L (ref 101–111)
Creatinine, Ser: 0.9 mg/dL (ref 0.61–1.24)
Creatinine, Ser: 0.9 mg/dL (ref 0.61–1.24)
Creatinine, Ser: 0.9 mg/dL (ref 0.61–1.24)
Creatinine, Ser: 0.9 mg/dL (ref 0.61–1.24)
Creatinine, Ser: 0.7 mg/dL (ref 0.61–1.24)
Creatinine, Ser: 0.9 mg/dL (ref 0.61–1.24)
Glucose, Bld: 126 mg/dL — ABNORMAL HIGH (ref 65–99)
Glucose, Bld: 107 mg/dL — ABNORMAL HIGH (ref 65–99)
Glucose, Bld: 123 mg/dL — ABNORMAL HIGH (ref 65–99)
Glucose, Bld: 149 mg/dL — ABNORMAL HIGH (ref 65–99)
Glucose, Bld: 157 mg/dL — ABNORMAL HIGH (ref 65–99)
Glucose, Bld: 156 mg/dL — ABNORMAL HIGH (ref 65–99)
HCT: 38 % — ABNORMAL LOW (ref 39.0–52.0)
HCT: 30 % — ABNORMAL LOW (ref 39.0–52.0)
HCT: 36 % — ABNORMAL LOW (ref 39.0–52.0)
HCT: 26 % — ABNORMAL LOW (ref 39.0–52.0)
HCT: 30 % — ABNORMAL LOW (ref 39.0–52.0)
HCT: 30 % — ABNORMAL LOW (ref 39.0–52.0)
Hemoglobin: 12.9 g/dL — ABNORMAL LOW (ref 13.0–17.0)
Hemoglobin: 10.2 g/dL — ABNORMAL LOW (ref 13.0–17.0)
Hemoglobin: 12.2 g/dL — ABNORMAL LOW (ref 13.0–17.0)
Hemoglobin: 8.8 g/dL — ABNORMAL LOW (ref 13.0–17.0)
Hemoglobin: 10.2 g/dL — ABNORMAL LOW (ref 13.0–17.0)
Hemoglobin: 10.2 g/dL — ABNORMAL LOW (ref 13.0–17.0)
Potassium: 4.1 mmol/L (ref 3.5–5.1)
Potassium: 4 mmol/L (ref 3.5–5.1)
Potassium: 4.2 mmol/L (ref 3.5–5.1)
Potassium: 4.3 mmol/L (ref 3.5–5.1)
Potassium: 3.9 mmol/L (ref 3.5–5.1)
Potassium: 3.9 mmol/L (ref 3.5–5.1)
Sodium: 140 mmol/L (ref 135–145)
Sodium: 134 mmol/L — ABNORMAL LOW (ref 135–145)
Sodium: 139 mmol/L (ref 135–145)
Sodium: 134 mmol/L — ABNORMAL LOW (ref 135–145)
Sodium: 138 mmol/L (ref 135–145)
Sodium: 139 mmol/L (ref 135–145)
TCO2: 28 mmol/L (ref 0–100)
TCO2: 25 mmol/L (ref 0–100)
TCO2: 28 mmol/L (ref 0–100)
TCO2: 25 mmol/L (ref 0–100)
TCO2: 24 mmol/L (ref 0–100)
TCO2: 23 mmol/L (ref 0–100)

## 2016-02-25 LAB — APTT: aPTT: 35 seconds (ref 24–36)

## 2016-02-25 LAB — POCT I-STAT 3, ART BLOOD GAS (G3+)
Acid-base deficit: 2 mmol/L (ref 0.0–2.0)
Acid-base deficit: 2 mmol/L (ref 0.0–2.0)
Acid-base deficit: 2 mmol/L (ref 0.0–2.0)
Acid-base deficit: 3 mmol/L — ABNORMAL HIGH (ref 0.0–2.0)
Bicarbonate: 25.2 mEq/L — ABNORMAL HIGH (ref 20.0–24.0)
Bicarbonate: 23 mEq/L (ref 20.0–24.0)
Bicarbonate: 23.4 mEq/L (ref 20.0–24.0)
Bicarbonate: 23.9 mEq/L (ref 20.0–24.0)
Bicarbonate: 22.5 mEq/L (ref 20.0–24.0)
O2 Saturation: 100 %
O2 Saturation: 100 %
O2 Saturation: 97 %
O2 Saturation: 98 %
O2 Saturation: 97 %
Patient temperature: 36.2
Patient temperature: 36.2
Patient temperature: 36.2
TCO2: 27 mmol/L (ref 0–100)
TCO2: 24 mmol/L (ref 0–100)
TCO2: 25 mmol/L (ref 0–100)
TCO2: 25 mmol/L (ref 0–100)
TCO2: 24 mmol/L (ref 0–100)
pCO2 arterial: 44.2 mmHg (ref 35.0–45.0)
pCO2 arterial: 39.8 mmHg (ref 35.0–45.0)
pCO2 arterial: 38.9 mmHg (ref 35.0–45.0)
pCO2 arterial: 43.9 mmHg (ref 35.0–45.0)
pCO2 arterial: 39.7 mmHg (ref 35.0–45.0)
pH, Arterial: 7.365 (ref 7.350–7.450)
pH, Arterial: 7.369 (ref 7.350–7.450)
pH, Arterial: 7.384 (ref 7.350–7.450)
pH, Arterial: 7.34 — ABNORMAL LOW (ref 7.350–7.450)
pH, Arterial: 7.357 (ref 7.350–7.450)
pO2, Arterial: 387 mmHg — ABNORMAL HIGH (ref 80.0–100.0)
pO2, Arterial: 331 mmHg — ABNORMAL HIGH (ref 80.0–100.0)
pO2, Arterial: 85 mmHg (ref 80.0–100.0)
pO2, Arterial: 115 mmHg — ABNORMAL HIGH (ref 80.0–100.0)
pO2, Arterial: 92 mmHg (ref 80.0–100.0)

## 2016-02-25 LAB — GLUCOSE, CAPILLARY
Glucose-Capillary: 107 mg/dL — ABNORMAL HIGH (ref 65–99)
Glucose-Capillary: 108 mg/dL — ABNORMAL HIGH (ref 65–99)
Glucose-Capillary: 116 mg/dL — ABNORMAL HIGH (ref 65–99)
Glucose-Capillary: 118 mg/dL — ABNORMAL HIGH (ref 65–99)
Glucose-Capillary: 127 mg/dL — ABNORMAL HIGH (ref 65–99)
Glucose-Capillary: 137 mg/dL — ABNORMAL HIGH (ref 65–99)
Glucose-Capillary: 144 mg/dL — ABNORMAL HIGH (ref 65–99)
Glucose-Capillary: 98 mg/dL (ref 65–99)

## 2016-02-25 LAB — CBC
HCT: 31.5 % — ABNORMAL LOW (ref 39.0–52.0)
HCT: 30.8 % — ABNORMAL LOW (ref 39.0–52.0)
HEMOGLOBIN: 10.4 g/dL — AB (ref 13.0–17.0)
Hemoglobin: 10 g/dL — ABNORMAL LOW (ref 13.0–17.0)
MCH: 30 pg (ref 26.0–34.0)
MCH: 29.2 pg (ref 26.0–34.0)
MCHC: 33 g/dL (ref 30.0–36.0)
MCHC: 32.5 g/dL (ref 30.0–36.0)
MCV: 90.8 fL (ref 78.0–100.0)
MCV: 90.1 fL (ref 78.0–100.0)
Platelets: 146 10*3/uL — ABNORMAL LOW (ref 150–400)
Platelets: 136 10*3/uL — ABNORMAL LOW (ref 150–400)
RBC: 3.47 MIL/uL — ABNORMAL LOW (ref 4.22–5.81)
RBC: 3.42 MIL/uL — ABNORMAL LOW (ref 4.22–5.81)
RDW: 13.1 % (ref 11.5–15.5)
RDW: 13 % (ref 11.5–15.5)
WBC: 13.3 10*3/uL — ABNORMAL HIGH (ref 4.0–10.5)
WBC: 10.5 10*3/uL (ref 4.0–10.5)

## 2016-02-25 LAB — HEMOGLOBIN AND HEMATOCRIT, BLOOD
HCT: 28.2 % — ABNORMAL LOW (ref 39.0–52.0)
Hemoglobin: 9.3 g/dL — ABNORMAL LOW (ref 13.0–17.0)

## 2016-02-25 LAB — POCT I-STAT 4, (NA,K, GLUC, HGB,HCT)
Glucose, Bld: 116 mg/dL — ABNORMAL HIGH (ref 65–99)
HCT: 32 % — ABNORMAL LOW (ref 39.0–52.0)
Hemoglobin: 10.9 g/dL — ABNORMAL LOW (ref 13.0–17.0)
Potassium: 3.4 mmol/L — ABNORMAL LOW (ref 3.5–5.1)
Sodium: 141 mmol/L (ref 135–145)

## 2016-02-25 LAB — PROTIME-INR
INR: 1.51
PROTHROMBIN TIME: 18.3 s — AB (ref 11.4–15.2)

## 2016-02-25 LAB — MAGNESIUM: Magnesium: 2.5 mg/dL — ABNORMAL HIGH (ref 1.7–2.4)

## 2016-02-25 LAB — CREATININE, SERUM
Creatinine, Ser: 0.95 mg/dL (ref 0.61–1.24)
GFR calc Af Amer: >60 mL/min (ref >60)
GFR calc non Af Amer: >60 mL/min (ref >60)

## 2016-02-25 LAB — PLATELET COUNT: Platelets: 137 10*3/uL — ABNORMAL LOW (ref 150–400)

## 2016-02-25 SURGERY — CORONARY ARTERY BYPASS GRAFTING (CABG)
Anesthesia: General | Site: Chest

## 2016-02-25 MED ORDER — SODIUM CHLORIDE 0.9 % IV SOLN
INTRAVENOUS | Status: DC
  Administered 2016-02-25: 18:00:00 via INTRAVENOUS
  Filled 2016-02-25 (×2): qty 2.5

## 2016-02-25 MED ORDER — LIDOCAINE 2% (20 MG/ML) 5 ML SYRINGE
INTRAMUSCULAR | Status: AC
  Filled 2016-02-25: qty 5

## 2016-02-25 MED ORDER — HEPARIN SODIUM (PORCINE) 1000 UNIT/ML IJ SOLN
INTRAMUSCULAR | Status: AC
  Filled 2016-02-25: qty 2

## 2016-02-25 MED ORDER — FENTANYL CITRATE (PF) 250 MCG/5ML IJ SOLN
INTRAMUSCULAR | Status: AC
  Filled 2016-02-25: qty 10

## 2016-02-25 MED ORDER — ROCURONIUM BROMIDE 100 MG/10ML IV SOLN
INTRAVENOUS | Status: DC | PRN
  Administered 2016-02-25 (×3): 100 mg via INTRAVENOUS
  Administered 2016-02-25: 50 mg via INTRAVENOUS

## 2016-02-25 MED ORDER — ONDANSETRON HCL 4 MG/2ML IJ SOLN
4.0000 mg | Freq: Four times a day (QID) | INTRAMUSCULAR | Status: DC | PRN
  Administered 2016-02-26 – 2016-02-27 (×3): 4 mg via INTRAVENOUS
  Filled 2016-02-25 (×3): qty 2

## 2016-02-25 MED ORDER — PHENYLEPHRINE HCL 10 MG/ML IJ SOLN
INTRAMUSCULAR | Status: AC
  Filled 2016-02-25: qty 1

## 2016-02-25 MED ORDER — VANCOMYCIN HCL IN DEXTROSE 1-5 GM/200ML-% IV SOLN
1000.0000 mg | Freq: Two times a day (BID) | INTRAVENOUS | Status: AC
  Administered 2016-02-25 – 2016-02-26 (×2): 1000 mg via INTRAVENOUS
  Filled 2016-02-25 (×2): qty 200

## 2016-02-25 MED ORDER — MORPHINE SULFATE (PF) 2 MG/ML IV SOLN: 1.0000 mg | INTRAVENOUS | Status: AC | PRN

## 2016-02-25 MED ORDER — LACTATED RINGERS IV SOLN
INTRAVENOUS | Status: DC | PRN
  Administered 2016-02-25: 07:00:00 via INTRAVENOUS

## 2016-02-25 MED ORDER — ASPIRIN EC 325 MG PO TBEC
325.0000 mg | DELAYED_RELEASE_TABLET | Freq: Every day | ORAL | Status: DC
  Administered 2016-02-26 – 2016-02-27 (×2): 325 mg via ORAL
  Filled 2016-02-25 (×3): qty 1

## 2016-02-25 MED ORDER — ASPIRIN 81 MG PO CHEW
324.0000 mg | CHEWABLE_TABLET | Freq: Every day | ORAL | Status: DC
Start: 2016-02-26 — End: 2016-02-27
  Filled 2016-02-25: qty 4

## 2016-02-25 MED ORDER — ROCURONIUM BROMIDE 10 MG/ML (PF) SYRINGE
PREFILLED_SYRINGE | INTRAVENOUS | Status: AC
  Filled 2016-02-25: qty 30

## 2016-02-25 MED ORDER — PROTAMINE SULFATE 10 MG/ML IV SOLN
INTRAVENOUS | Status: AC
  Filled 2016-02-25: qty 25

## 2016-02-25 MED ORDER — DEXMEDETOMIDINE HCL IN NACL 200 MCG/50ML IV SOLN
0.0000 ug/kg/h | INTRAVENOUS | Status: DC
  Filled 2016-02-25: qty 50

## 2016-02-25 MED ORDER — FAMOTIDINE IN NACL 20-0.9 MG/50ML-% IV SOLN
20.0000 mg | Freq: Two times a day (BID) | INTRAVENOUS | Status: DC
  Administered 2016-02-25: 20 mg via INTRAVENOUS

## 2016-02-25 MED ORDER — METOPROLOL TARTRATE 12.5 MG HALF TABLET
12.5000 mg | ORAL_TABLET | Freq: Two times a day (BID) | ORAL | Status: DC
  Administered 2016-02-26 – 2016-02-27 (×3): 12.5 mg via ORAL
  Filled 2016-02-25 (×4): qty 1

## 2016-02-25 MED ORDER — ACETAMINOPHEN 160 MG/5ML PO SOLN: 1000.0000 mg | Freq: Four times a day (QID) | ORAL | Status: DC

## 2016-02-25 MED ORDER — OXYCODONE HCL 5 MG PO TABS
5.0000 mg | ORAL_TABLET | ORAL | Status: DC | PRN
  Administered 2016-02-26: 5 mg via ORAL
  Administered 2016-02-27: 10 mg via ORAL
  Filled 2016-02-25: qty 1
  Filled 2016-02-25: qty 2

## 2016-02-25 MED ORDER — HEPARIN SODIUM (PORCINE) 1000 UNIT/ML IJ SOLN
INTRAMUSCULAR | Status: DC | PRN
Start: 2016-02-25 — End: 2016-02-25
  Administered 2016-02-25: 41 mL via INTRAVENOUS
  Administered 2016-02-25: 4 mL via INTRAVENOUS

## 2016-02-25 MED ORDER — HEMOSTATIC AGENTS (NO CHARGE) OPTIME
TOPICAL | Status: DC | PRN
  Administered 2016-02-25 (×2): 1 via TOPICAL

## 2016-02-25 MED ORDER — PANTOPRAZOLE SODIUM 40 MG PO TBEC
40.0000 mg | DELAYED_RELEASE_TABLET | Freq: Every day | ORAL | Status: DC
Start: 2016-02-27 — End: 2016-02-29
  Administered 2016-02-27 – 2016-02-29 (×3): 40 mg via ORAL
  Filled 2016-02-25 (×3): qty 1

## 2016-02-25 MED ORDER — SODIUM CHLORIDE 0.9 % IV SOLN: INTRAVENOUS | Status: DC

## 2016-02-25 MED ORDER — ALBUMIN HUMAN 5 % IV SOLN
INTRAVENOUS | Status: DC | PRN
  Administered 2016-02-25 (×2): via INTRAVENOUS

## 2016-02-25 MED ORDER — 0.9 % SODIUM CHLORIDE (POUR BTL) OPTIME
TOPICAL | Status: DC | PRN
  Administered 2016-02-25: 5000 mL
  Administered 2016-02-25: 1000 mL

## 2016-02-25 MED ORDER — PHENYLEPHRINE HCL 10 MG/ML IJ SOLN
INTRAVENOUS | Status: DC | PRN
  Administered 2016-02-25: 40 ug/min via INTRAVENOUS

## 2016-02-25 MED ORDER — PHENYLEPHRINE HCL 10 MG/ML IJ SOLN
0.0000 ug/min | INTRAVENOUS | Status: DC
  Filled 2016-02-25: qty 2

## 2016-02-25 MED ORDER — CHLORHEXIDINE GLUCONATE 0.12 % MT SOLN
15.0000 mL | OROMUCOSAL | Status: AC
  Administered 2016-02-25: 15 mL via OROMUCOSAL
  Filled 2016-02-25: qty 15

## 2016-02-25 MED ORDER — LACTATED RINGERS IV SOLN
INTRAVENOUS | Status: DC | PRN
  Administered 2016-02-25 (×2): via INTRAVENOUS

## 2016-02-25 MED ORDER — SODIUM CHLORIDE 0.9 % IV SOLN
INTRAVENOUS | Status: DC | PRN
  Administered 2016-02-25: 13:00:00 via INTRAVENOUS
  Administered 2016-02-25: 5 g/h via INTRAVENOUS

## 2016-02-25 MED ORDER — METOPROLOL TARTRATE 5 MG/5ML IV SOLN: 2.5000 mg | INTRAVENOUS | Status: DC | PRN

## 2016-02-25 MED ORDER — POTASSIUM CHLORIDE 10 MEQ/50ML IV SOLN
10.0000 meq | INTRAVENOUS | Status: AC
  Administered 2016-02-25 (×2): 10 meq via INTRAVENOUS

## 2016-02-25 MED ORDER — SODIUM CHLORIDE 0.9 % IV SOLN: 250.0000 mL | INTRAVENOUS | Status: DC

## 2016-02-25 MED ORDER — MIDAZOLAM HCL 10 MG/2ML IJ SOLN
INTRAMUSCULAR | Status: AC
  Filled 2016-02-25: qty 2

## 2016-02-25 MED ORDER — PROPOFOL 10 MG/ML IV BOLUS
INTRAVENOUS | Status: AC
  Filled 2016-02-25: qty 40

## 2016-02-25 MED ORDER — SODIUM CHLORIDE 0.9% FLUSH: 3.0000 mL | INTRAVENOUS | Status: DC | PRN

## 2016-02-25 MED ORDER — ROCURONIUM BROMIDE 10 MG/ML (PF) SYRINGE
PREFILLED_SYRINGE | INTRAVENOUS | Status: AC
  Filled 2016-02-25: qty 10

## 2016-02-25 MED ORDER — MIDAZOLAM HCL 2 MG/2ML IJ SOLN: 2.0000 mg | INTRAMUSCULAR | Status: DC | PRN

## 2016-02-25 MED ORDER — ALBUMIN HUMAN 5 % IV SOLN: 250.0000 mL | INTRAVENOUS | Status: DC | PRN

## 2016-02-25 MED ORDER — ACETAMINOPHEN 160 MG/5ML PO SOLN: 650.0000 mg | Freq: Once | ORAL | Status: AC

## 2016-02-25 MED ORDER — METOPROLOL TARTRATE 25 MG/10 ML ORAL SUSPENSION: 12.5000 mg | Freq: Two times a day (BID) | ORAL | Status: DC

## 2016-02-25 MED ORDER — MAGNESIUM SULFATE 4 GM/100ML IV SOLN
4.0000 g | Freq: Once | INTRAVENOUS | Status: AC
  Administered 2016-02-25: 4 g via INTRAVENOUS
  Filled 2016-02-25: qty 100

## 2016-02-25 MED ORDER — PROPOFOL 10 MG/ML IV BOLUS
INTRAVENOUS | Status: DC | PRN
  Administered 2016-02-25: 120 mg via INTRAVENOUS

## 2016-02-25 MED ORDER — ACETAMINOPHEN 500 MG PO TABS
1000.0000 mg | ORAL_TABLET | Freq: Four times a day (QID) | ORAL | Status: DC
  Administered 2016-02-26 – 2016-02-29 (×9): 1000 mg via ORAL
  Filled 2016-02-25 (×10): qty 2

## 2016-02-25 MED ORDER — CHLORHEXIDINE GLUCONATE 4 % EX LIQD: 30.0000 mL | CUTANEOUS | Status: DC

## 2016-02-25 MED ORDER — TRAMADOL HCL 50 MG PO TABS: 50.0000 mg | ORAL_TABLET | ORAL | Status: DC | PRN

## 2016-02-25 MED ORDER — ATORVASTATIN CALCIUM 80 MG PO TABS
80.0000 mg | ORAL_TABLET | Freq: Every day | ORAL | Status: DC
  Administered 2016-02-26 – 2016-02-27 (×2): 80 mg via ORAL
  Filled 2016-02-25 (×2): qty 1

## 2016-02-25 MED ORDER — ACETAMINOPHEN 650 MG RE SUPP
650.0000 mg | Freq: Once | RECTAL | Status: AC
  Administered 2016-02-25: 650 mg via RECTAL

## 2016-02-25 MED ORDER — LATANOPROST 0.005 % OP SOLN
1.0000 [drp] | Freq: Every day | OPHTHALMIC | Status: DC
  Filled 2016-02-25: qty 2.5

## 2016-02-25 MED ORDER — PROTAMINE SULFATE 10 MG/ML IV SOLN
INTRAVENOUS | Status: DC | PRN
  Administered 2016-02-25: 340 mg via INTRAVENOUS

## 2016-02-25 MED ORDER — SODIUM CHLORIDE 0.45 % IV SOLN
INTRAVENOUS | Status: DC | PRN
  Administered 2016-02-25: 15:00:00 via INTRAVENOUS

## 2016-02-25 MED ORDER — BISACODYL 10 MG RE SUPP: 10.0000 mg | Freq: Every day | RECTAL | Status: DC

## 2016-02-25 MED ORDER — PHENYLEPHRINE HCL 10 MG/ML IJ SOLN
INTRAMUSCULAR | Status: DC | PRN
  Administered 2016-02-25: 40 ug via INTRAVENOUS

## 2016-02-25 MED ORDER — FENTANYL CITRATE (PF) 100 MCG/2ML IJ SOLN
INTRAMUSCULAR | Status: DC | PRN
  Administered 2016-02-25 (×2): 250 ug via INTRAVENOUS
  Administered 2016-02-25: 150 ug via INTRAVENOUS
  Administered 2016-02-25: 250 ug via INTRAVENOUS
  Administered 2016-02-25 (×3): 100 ug via INTRAVENOUS
  Administered 2016-02-25: 50 ug via INTRAVENOUS
  Administered 2016-02-25: 150 ug via INTRAVENOUS
  Administered 2016-02-25: 100 ug via INTRAVENOUS

## 2016-02-25 MED ORDER — INSULIN REGULAR BOLUS VIA INFUSION
0.0000 [IU] | Freq: Three times a day (TID) | INTRAVENOUS | Status: DC
  Filled 2016-02-25: qty 10

## 2016-02-25 MED ORDER — ALBUMIN HUMAN 5 % IV SOLN
250.0000 mL | INTRAVENOUS | Status: AC | PRN
  Administered 2016-02-25 (×2): 250 mL via INTRAVENOUS

## 2016-02-25 MED ORDER — POTASSIUM CHLORIDE 10 MEQ/50ML IV SOLN
10.0000 meq | INTRAVENOUS | Status: AC
  Administered 2016-02-25 (×3): 10 meq via INTRAVENOUS

## 2016-02-25 MED ORDER — LACTATED RINGERS IV SOLN: INTRAVENOUS | Status: DC

## 2016-02-25 MED ORDER — PHENYLEPHRINE 40 MCG/ML (10ML) SYRINGE FOR IV PUSH (FOR BLOOD PRESSURE SUPPORT)
PREFILLED_SYRINGE | INTRAVENOUS | Status: AC
  Filled 2016-02-25: qty 10

## 2016-02-25 MED ORDER — SODIUM CHLORIDE 0.9% FLUSH
3.0000 mL | Freq: Two times a day (BID) | INTRAVENOUS | Status: DC
  Administered 2016-02-26 – 2016-02-29 (×5): 3 mL via INTRAVENOUS

## 2016-02-25 MED ORDER — MORPHINE SULFATE (PF) 2 MG/ML IV SOLN
2.0000 mg | INTRAVENOUS | Status: DC | PRN
  Administered 2016-02-25 – 2016-02-26 (×2): 2 mg via INTRAVENOUS
  Administered 2016-02-26: 4 mg via INTRAVENOUS
  Filled 2016-02-25 (×2): qty 1
  Filled 2016-02-25: qty 2

## 2016-02-25 MED ORDER — SODIUM CHLORIDE 0.9 % IV SOLN
INTRAVENOUS | Status: DC | PRN
  Administered 2016-02-25: 0.2 ug/kg/h via INTRAVENOUS

## 2016-02-25 MED ORDER — NITROGLYCERIN IN D5W 200-5 MCG/ML-% IV SOLN: 0.0000 ug/min | INTRAVENOUS | Status: DC

## 2016-02-25 MED ORDER — SODIUM CHLORIDE 0.9 % IV SOLN
5.0000 g | Freq: Once | INTRAVENOUS | Status: DC
  Filled 2016-02-25 (×2): qty 20

## 2016-02-25 MED ORDER — INSULIN REGULAR HUMAN 100 UNIT/ML IJ SOLN
INTRAMUSCULAR | Status: DC | PRN
  Administered 2016-02-25: 1 [IU]/h via INTRAVENOUS

## 2016-02-25 MED ORDER — LACTATED RINGERS IV SOLN: 500.0000 mL | Freq: Once | INTRAVENOUS | Status: DC | PRN

## 2016-02-25 MED ORDER — VANCOMYCIN HCL IN DEXTROSE 1-5 GM/200ML-% IV SOLN
1000.0000 mg | Freq: Once | INTRAVENOUS | Status: DC
  Filled 2016-02-25: qty 200

## 2016-02-25 MED ORDER — LACTATED RINGERS IV SOLN
INTRAVENOUS | Status: DC | PRN
  Administered 2016-02-25: 08:00:00 via INTRAVENOUS

## 2016-02-25 MED ORDER — MIDAZOLAM HCL 5 MG/5ML IJ SOLN
INTRAMUSCULAR | Status: DC | PRN
  Administered 2016-02-25: 1 mg via INTRAVENOUS
  Administered 2016-02-25: 3 mg via INTRAVENOUS
  Administered 2016-02-25: 1 mg via INTRAVENOUS

## 2016-02-25 MED ORDER — FENTANYL CITRATE (PF) 250 MCG/5ML IJ SOLN
INTRAMUSCULAR | Status: AC
  Filled 2016-02-25: qty 20

## 2016-02-25 MED ORDER — CHLORHEXIDINE GLUCONATE 0.12% ORAL RINSE (MEDLINE KIT)
15.0000 mL | Freq: Two times a day (BID) | OROMUCOSAL | Status: DC
  Administered 2016-02-25 – 2016-02-26 (×2): 15 mL via OROMUCOSAL

## 2016-02-25 MED ORDER — DOCUSATE SODIUM 100 MG PO CAPS
200.0000 mg | ORAL_CAPSULE | Freq: Every day | ORAL | Status: DC
  Administered 2016-02-26 – 2016-02-29 (×3): 200 mg via ORAL
  Filled 2016-02-25 (×4): qty 2

## 2016-02-25 MED ORDER — SODIUM CHLORIDE 0.9 % IJ SOLN
OROMUCOSAL | Status: DC | PRN
  Administered 2016-02-25 (×3): 4 mL via TOPICAL

## 2016-02-25 MED ORDER — ANTISEPTIC ORAL RINSE SOLUTION (CORINZ)
7.0000 mL | Freq: Four times a day (QID) | OROMUCOSAL | Status: DC
  Administered 2016-02-26: 7 mL via OROMUCOSAL

## 2016-02-25 MED ORDER — DEXTROSE 5 % IV SOLN
1.5000 g | Freq: Two times a day (BID) | INTRAVENOUS | Status: AC
  Administered 2016-02-25 – 2016-02-27 (×4): 1.5 g via INTRAVENOUS
  Filled 2016-02-25 (×4): qty 1.5

## 2016-02-25 MED ORDER — BISACODYL 5 MG PO TBEC
10.0000 mg | DELAYED_RELEASE_TABLET | Freq: Every day | ORAL | Status: DC
  Administered 2016-02-26 – 2016-02-29 (×3): 10 mg via ORAL
  Filled 2016-02-25 (×4): qty 2

## 2016-02-25 MED FILL — Potassium Chloride Inj 2 mEq/ML: INTRAVENOUS | Qty: 40 | Status: AC

## 2016-02-25 MED FILL — Magnesium Sulfate Inj 50%: INTRAMUSCULAR | Qty: 10 | Status: AC

## 2016-02-25 MED FILL — Heparin Sodium (Porcine) Inj 1000 Unit/ML: INTRAMUSCULAR | Qty: 30 | Status: AC

## 2016-02-25 SURGICAL SUPPLY — 101 items
ADAPTER CARDIO PERF ANTE/RETRO (ADAPTER) ×3 IMPLANT
BAG DECANTER FOR FLEXI CONT (MISCELLANEOUS) ×3 IMPLANT
BANDAGE ELASTIC 4 VELCRO ST LF (GAUZE/BANDAGES/DRESSINGS) ×3 IMPLANT
BANDAGE ELASTIC 6 VELCRO ST LF (GAUZE/BANDAGES/DRESSINGS) ×3 IMPLANT
BASKET HEART  (ORDER IN 25'S) (MISCELLANEOUS) ×1
BASKET HEART (ORDER IN 25'S) (MISCELLANEOUS) ×1
BASKET HEART (ORDER IN 25S) (MISCELLANEOUS) ×1 IMPLANT
BLADE STERNUM SYSTEM 6 (BLADE) ×3 IMPLANT
BLADE SURG 11 STRL SS (BLADE) ×3 IMPLANT
BLADE SURG 12 STRL SS (BLADE) ×3 IMPLANT
BLADE SURG ROTATE 9660 (MISCELLANEOUS) IMPLANT
BNDG GAUZE ELAST 4 BULKY (GAUZE/BANDAGES/DRESSINGS) ×3 IMPLANT
CANISTER SUCTION 2500CC (MISCELLANEOUS) ×3 IMPLANT
CANNULA ARTERIAL NVNT 3/8 22FR (MISCELLANEOUS) ×3 IMPLANT
CANNULA GUNDRY RCSP 15FR (MISCELLANEOUS) ×3 IMPLANT
CATH CPB KIT VANTRIGT (MISCELLANEOUS) ×3 IMPLANT
CATH ROBINSON RED A/P 18FR (CATHETERS) ×9 IMPLANT
CATH THORACIC 36FR RT ANG (CATHETERS) ×3 IMPLANT
CLIP TI WIDE RED SMALL 24 (CLIP) ×3 IMPLANT
COVER SURGICAL LIGHT HANDLE (MISCELLANEOUS) ×3 IMPLANT
CRADLE DONUT ADULT HEAD (MISCELLANEOUS) ×3 IMPLANT
DERMABOND ADVANCED (GAUZE/BANDAGES/DRESSINGS) ×2
DERMABOND ADVANCED .7 DNX12 (GAUZE/BANDAGES/DRESSINGS) ×1 IMPLANT
DRAIN CHANNEL 32F RND 10.7 FF (WOUND CARE) ×3 IMPLANT
DRAPE CARDIOVASCULAR INCISE (DRAPES) ×2
DRAPE SLUSH/WARMER DISC (DRAPES) ×3 IMPLANT
DRAPE SRG 135X102X78XABS (DRAPES) ×1 IMPLANT
DRSG AQUACEL AG ADV 3.5X14 (GAUZE/BANDAGES/DRESSINGS) ×3 IMPLANT
ELECT BLADE 4.0 EZ CLEAN MEGAD (MISCELLANEOUS) ×3
ELECT BLADE 6.5 EXT (BLADE) ×3 IMPLANT
ELECT CAUTERY BLADE 6.4 (BLADE) ×3 IMPLANT
ELECT REM PT RETURN 9FT ADLT (ELECTROSURGICAL) ×6
ELECTRODE BLDE 4.0 EZ CLN MEGD (MISCELLANEOUS) ×1 IMPLANT
ELECTRODE REM PT RTRN 9FT ADLT (ELECTROSURGICAL) ×2 IMPLANT
FELT TEFLON 1X6 (Vascular Products) ×6 IMPLANT
GAUZE SPONGE 4X4 12PLY STRL (GAUZE/BANDAGES/DRESSINGS) ×6 IMPLANT
GLOVE BIO SURGEON STRL SZ7 (GLOVE) ×9 IMPLANT
GLOVE BIO SURGEON STRL SZ7.5 (GLOVE) ×9 IMPLANT
GLOVE SURG SS PI 6.0 STRL IVOR (GLOVE) ×3 IMPLANT
GOWN STRL REUS W/ TWL LRG LVL3 (GOWN DISPOSABLE) ×5 IMPLANT
GOWN STRL REUS W/TWL LRG LVL3 (GOWN DISPOSABLE) ×10
HEMOSTAT POWDER SURGIFOAM 1G (HEMOSTASIS) ×9 IMPLANT
HEMOSTAT SURGICEL 2X14 (HEMOSTASIS) ×3 IMPLANT
INSERT FOGARTY XLG (MISCELLANEOUS) IMPLANT
KIT BASIN OR (CUSTOM PROCEDURE TRAY) ×3 IMPLANT
KIT ROOM TURNOVER OR (KITS) ×3 IMPLANT
KIT SUCTION CATH 14FR (SUCTIONS) ×3 IMPLANT
KIT VASOVIEW 6 PRO VH 2400 (KITS) ×3 IMPLANT
KIT VASOVIEW HEMOPRO VH 3000 (KITS) ×3 IMPLANT
LEAD PACING MYOCARDI (MISCELLANEOUS) ×3 IMPLANT
MARKER GRAFT CORONARY BYPASS (MISCELLANEOUS) ×9 IMPLANT
NS IRRIG 1000ML POUR BTL (IV SOLUTION) ×15 IMPLANT
PACK OPEN HEART (CUSTOM PROCEDURE TRAY) ×3 IMPLANT
PAD ARMBOARD 7.5X6 YLW CONV (MISCELLANEOUS) ×6 IMPLANT
PAD ELECT DEFIB RADIOL ZOLL (MISCELLANEOUS) ×3 IMPLANT
PENCIL BUTTON HOLSTER BLD 10FT (ELECTRODE) ×3 IMPLANT
PUNCH AORTIC ROTATE  4.5MM 8IN (MISCELLANEOUS) ×3 IMPLANT
PUNCH AORTIC ROTATE 4.0MM (MISCELLANEOUS) IMPLANT
PUNCH AORTIC ROTATE 4.5MM 8IN (MISCELLANEOUS) IMPLANT
PUNCH AORTIC ROTATE 5MM 8IN (MISCELLANEOUS) IMPLANT
SET CARDIOPLEGIA MPS 5001102 (MISCELLANEOUS) ×3 IMPLANT
SOLUTION ANTI FOG 6CC (MISCELLANEOUS) ×3 IMPLANT
SPONGE GAUZE 4X4 12PLY STER LF (GAUZE/BANDAGES/DRESSINGS) ×3 IMPLANT
SPONGE LAP 18X18 X RAY DECT (DISPOSABLE) ×3 IMPLANT
SURGIFLO W/THROMBIN 8M KIT (HEMOSTASIS) ×3 IMPLANT
SUT BONE WAX W31G (SUTURE) ×3 IMPLANT
SUT MNCRL AB 4-0 PS2 18 (SUTURE) IMPLANT
SUT PROLENE 3 0 SH DA (SUTURE) IMPLANT
SUT PROLENE 3 0 SH1 36 (SUTURE) IMPLANT
SUT PROLENE 4 0 RB 1 (SUTURE) ×2
SUT PROLENE 4 0 SH DA (SUTURE) ×3 IMPLANT
SUT PROLENE 4-0 RB1 .5 CRCL 36 (SUTURE) ×1 IMPLANT
SUT PROLENE 5 0 C 1 36 (SUTURE) IMPLANT
SUT PROLENE 6 0 C 1 30 (SUTURE) IMPLANT
SUT PROLENE 6 0 CC (SUTURE) ×9 IMPLANT
SUT PROLENE 7 0 BV1 MDA (SUTURE) IMPLANT
SUT PROLENE 8 0 BV175 6 (SUTURE) ×12 IMPLANT
SUT PROLENE BLUE 7 0 (SUTURE) ×9 IMPLANT
SUT SILK  1 MH (SUTURE)
SUT SILK 1 MH (SUTURE) IMPLANT
SUT SILK 2 0 SH CR/8 (SUTURE) ×3 IMPLANT
SUT SILK 3 0 SH CR/8 (SUTURE) IMPLANT
SUT STEEL 6MS V (SUTURE) IMPLANT
SUT STEEL STERNAL CCS#1 18IN (SUTURE) ×3 IMPLANT
SUT STEEL SZ 6 DBL 3X14 BALL (SUTURE) ×3 IMPLANT
SUT VIC AB 1 CTX 36 (SUTURE) ×4
SUT VIC AB 1 CTX36XBRD ANBCTR (SUTURE) ×2 IMPLANT
SUT VIC AB 2-0 CT1 27 (SUTURE)
SUT VIC AB 2-0 CT1 TAPERPNT 27 (SUTURE) IMPLANT
SUT VIC AB 2-0 CTX 27 (SUTURE) IMPLANT
SUT VIC AB 3-0 X1 27 (SUTURE) IMPLANT
SUTURE E-PAK OPEN HEART (SUTURE) ×3 IMPLANT
SYSTEM SAHARA CHEST DRAIN ATS (WOUND CARE) ×3 IMPLANT
TAPE CLOTH SURG 4X10 WHT LF (GAUZE/BANDAGES/DRESSINGS) ×3 IMPLANT
TAPE PAPER MEDFIX 1IN X 10YD (GAUZE/BANDAGES/DRESSINGS) ×3 IMPLANT
TOWEL OR 17X24 6PK STRL BLUE (TOWEL DISPOSABLE) ×6 IMPLANT
TOWEL OR 17X26 10 PK STRL BLUE (TOWEL DISPOSABLE) ×6 IMPLANT
TRAY FOLEY IC TEMP SENS 16FR (CATHETERS) ×3 IMPLANT
TUBING INSUFFLATION (TUBING) ×3 IMPLANT
UNDERPAD 30X30 INCONTINENT (UNDERPADS AND DIAPERS) ×3 IMPLANT
WATER STERILE IRR 1000ML POUR (IV SOLUTION) ×6 IMPLANT

## 2016-02-25 NOTE — Anesthesia Procedure Notes (Signed)
Central Venous Catheter Insertion Performed by: anesthesiologist Patient location: OR. Preanesthetic checklist: patient identified, IV checked, site marked, risks and benefits discussed, surgical consent, monitors and equipment checked, pre-op evaluation, timeout performed and anesthesia consent Landmarks identified Catheter size: 8.5 Fr Sheath introducer Procedure performed using ultrasound guided technique. Attempts: 1 Following insertion, line sutured and dressing applied. Post procedure assessment: blood return through all ports, free fluid flow and no air. Patient tolerated the procedure well with no immediate complications.

## 2016-02-25 NOTE — Procedures (Signed)
Extubation Procedure Note  Patient Details:   Name: Bobby Price DOB: 28-Jul-1945 MRN: [id number]   Airway Documentation:     Evaluation  O2 sats: stable throughout Complications: No apparent complications Patient did tolerate procedure well. Bilateral Breath Sounds: Clear   Yes   Pt. Was extubated to a 4L [place] without any complications, dyspnea or stridor noted. Pt. Achieved a goal of 1.2L on VC & -20 on NIF. Pt. Was instructed on IS x 6, highest goal reached was 1,13400mL.  [name], [name] 02/25/2016, 6:23 PM

## 2016-02-25 NOTE — OR Nursing (Signed)
14:15 - 20 minute call to SICU charge nurse

## 2016-02-25 NOTE — Brief Op Note (Signed)
02/25/2016      [address]       [place] [id number]             [phone]     02/25/2016  11:51 AM  PATIENT:  Bobby Price  70 y.o. male  PRE-OPERATIVE DIAGNOSIS:  CAD  POST-OPERATIVE DIAGNOSIS:  CAD  PROCEDURE:  Procedure(s): CORONARY ARTERY BYPASS GRAFTING (CABG)X4  LIMA-LAD; SEQ SVG-OM1-OM2; SVG-PD INTRAOPERATIVE TRANSESOPHAGEAL ECHOCARDIOGRAM ENDOSCOPIC GREATER SAPHENOUS VEIN HARVEST(EVH) RIGHT LEG  SURGEON:  Surgeon(s): Kerin PernaPeter Van Trigt, MD  PHYSICIAN ASSISTANT: TESSA CONTE PA-C; WAYNE GOLD PA-C  ANESTHESIA:   general  PATIENT CONDITION:  ICU - intubated and hemodynamically stable.  PRE-OPERATIVE WEIGHT: 108kg  COMPLICATIONS : NO KNOWN

## 2016-02-25 NOTE — Progress Notes (Signed)
CT surgery p.m. Rounds  Status post CABG 4 earlier today History of previous DMI with PCI 4 weeks ago Patient activated with stable hemodynamics Normal sinus rhythm off pressors Doing well

## 2016-02-25 NOTE — Op Note (Signed)
NAMDesma Maxim:  Bobby Price, Bobby Price              ACCOUNT NO.:  [id number]  MEDICAL RECORD NO.:  [id number]  LOCATION:  2S04C                        FACILITY:  [facility]  PHYSICIAN:  Kerin PernaPeter Van Trigt, M.D.  DATE OF BIRTH:  02-23-1946  DATE OF PROCEDURE:  02/25/2016 DATE OF DISCHARGE:                              OPERATIVE REPORT   OPERATION: 1. Coronary artery bypass grafting x4 (left internal mammary artery to     LAD, saphenous vein graft to posterior descending, sequential     saphenous vein graft to OM1 and OM2). 2. Endoscopic harvest of right leg greater saphenous vein.  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  SURGEON:  Kerin PernaPeter Van Trigt, M.D.  ASSISTANT:  Jari Favreessa Conte, PA-C, and Rowe ClackWayne E. Gold, P.A.-C.  ANESTHESIA:  General by Dr. Val Eaglehristopher Moser.  INDICATIONS:  The patient is an obese 70 year old reformed smoker with exertional chest pain, increasing in intensity and frequency.  Cardiac catheterization was performed.  When the patient presented with acute severe at rest chest pain with ST-segment elevation and was felt to have an ST elevation MI.  He underwent emergency catheterization last month by Dr. Clifton JamesMcAlhany which demonstrated total occlusion of the distal RCA. He also had moderate-to-severe disease of the LAD and circumflex systems.  He underwent successful reopening of the vessel with a bare metal stent.  The patient had an uncomplicated post PCI course, remained stable.  Because of his residual multivessel CAD, the plan was to allow the patient a month to 6 weeks to recover on Brilinta and then readmit for elective surgical revascularization.  Prior to surgery, I examined the patient in the office and determined that he had been progressing well and was stable.  He had no recurrent angina.  We planned an elective  multivessel CABG after he had been off his Brilinta for 5 days.  I discussed the major aspects of the procedure with the patient and his family including the use of general anesthesia and cardiopulmonary bypass, the location of the surgical incisions, and the expected postoperative hospital recovery.  I reviewed the potential risks to him of the operation including risks of bleeding, blood transfusion, stroke, MI, infection, postoperative pulmonary problems including pleural effusion, and death.  After reviewing these issues, he demonstrated understanding and agreed to proceed with surgery under what I felt was an informed consent.  FINDINGS: 1. Small posterior descending target, diffusely diseased LAD target. 2. Adequate but slightly sclerotic saphenous vein conduit. 3. No packed cell transfusion required.  OPERATIVE PROCEDURE:  The patient was brought to the operating room, placed supine on the operating table, general anesthesia was induced under invasive hemodynamic monitoring.  The chest, abdomen, and legs were prepped with Betadine and draped as a sterile field.  A transesophageal echo probe was placed by the anesthesiologist.  A proper time-out was performed.  A sternal incision was made as the saphenous vein was harvested endoscopically from the right leg.  The left internal mammary artery was harvested as a pedicle graft from its origin at the subclavian vessels.  It was a good vessel with  good flow.  The sternal retractor was placed, and the pericardium was opened and suspended. Pursestrings were placed in the right atrium and the ascending aorta. When the vein was harvested and checked and found to be adequate.  The patient was heparinized and cannulated and placed on cardiopulmonary bypass.  The coronaries were identified for grafting, and the mammary artery and vein grafts were prepared for the distal anastomoses. Cardioplegia cannulas were placed both antegrade and  retrograde cold blood cardioplegia.  The patient was cooled to 32 degrees and an aortic crossclamp was applied.  One liter of cold blood cardioplegia was delivered in split doses between the antegrade aortic and retrograde coronary sinus catheters.  There was good cardioplegic arrest, and septal temperature dropped less than 12 degrees.  Cardioplegia was delivered every 20 minutes.  The distal coronary anastomoses were performed.  The first distal anastomosis was to the posterior descending.  This was a 1.2-mm vessel with heavy calcified plaque and the stent proximally.  A reverse saphenous vein was sewn end-to-side with running 7-0 Prolene with good flow through the graft.  Cardioplegia was redosed.  The second and third distal anastomoses consisted of a sequential vein graft to the OM1 and OM2.  The OM1 was 1.5-mm vessel with proximal 80% stenosis.  The side of the vein was sewn side-to-side with running 7-0 Prolene with good flow through the graft.  The 3rd distal anastomosis was continuation of the sequential vein graft to OM2.  This was a 1.6 mm vessel with proximal 80-90% stenosis.  The vein was sewn end-to-side with running 7-0 Prolene.  There was good flow through the graft. Cardioplegia was redosed.  The fourth distal anastomosis was the distal third of the LAD.  More proximally, there was heavily calcified plaque.  The left IMA pedicle was brought through an opening and the left lateral pericardium was brought down onto the LAD and sewn end-to-side with running 8-0 Prolene. There was good flow through the anastomosis after briefly releasing the pedicle bulldog on the mammary artery.  The bulldog was reapplied and the pedicle was secured to the epicardium.  Cardioplegia redosed.  With the crossclamp was still in place, 2 proximal vein anastomoses were performed for the RCA graft and the sequential OM1 and OM2 graft.  This was completed with a running 6-0 Prolene and air was  vented from the coronaries with a dose of retrograde warm blood cardioplegia.  The crossclamp was then removed.  The heart resumed a spontaneous rhythm.  The vein grafts were de-aired and opened.  Each had good flow.  Hemostasis was documented at the proximal and distal sites.  The patient was rewarmed and reperfused. Temporary pacing wires were applied.  The lungs were expanded, the ventilator was initiated.  The patient was then weaned off cardiopulmonary bypass without difficulty with stable hemodynamics. Normal echo and normal cardiac output.  Protamine was administered without adverse reaction.  There was still considerable diffuse oozing consistent with his long course of preoperative Brilinta.  The patient was given 1 FFP and 1 plate with improved coagulation function.  The superior pericardial fat was closed over the aorta.  Anterior mediastinal and left pleural chest tubes were placed and brought out through separate incisions.  The sternum was closed with interrupted wire.  The pectoralis fascia was closed with a running #1 Vicryl.  The subcutaneous and skin layers were closed in running Vicryl and sterile dressings were applied.  Total cardiopulmonary bypass time 130 minutes.  [name], M.D.     PV/MEDQ  D:  02/25/2016  T:  02/25/2016  Job:  [id number]  cc:   Verne Carrow, Dr.

## 2016-02-25 NOTE — Anesthesia Procedure Notes (Signed)
Central Venous Catheter Insertion Performed by: anesthesiologist Patient location: OR. Preanesthetic checklist: patient identified, IV checked, site marked, risks and benefits discussed, surgical consent, monitors and equipment checked, pre-op evaluation, timeout performed and anesthesia consent Position: supine PA cath was placed.Swan type and PA catheter depth:thermodilutionProcedure performed without using ultrasound guided technique. Attempts: 1

## 2016-02-25 NOTE — OR Nursing (Signed)
1st call made @1344  to SICU Donnamae Jude(Keshia) 2nd call made @1400  to SICU Donnamae Jude(Keshia), confirming plans for transfer to Room 4 postoperatively.  4th (final) call made @1446 , confirming plans for transfer to SICU.

## 2016-02-25 NOTE — Transfer of Care (Signed)
Immediate Anesthesia Transfer of Care Note  Patient: Bobby Price  Procedure(s) Performed: Procedure(s): CORONARY ARTERY BYPASS GRAFTING (CABG)X4  LIMA-LAD; SEQ SVG-OM1-OM2; SVG-PD ENDOSCOPIC GREATER SAPHENOUS VEIN HARVEST(EVH) RIGHT LEG (N/A) INTRAOPERATIVE TRANSESOPHAGEAL ECHOCARDIOGRAM (N/A)  Patient Location: SICU  Anesthesia Type:General  Level of Consciousness: Patient remains intubated per anesthesia plan  Airway & Oxygen Therapy: Patient remains intubated per anesthesia plan and Patient placed on Ventilator (see vital sign flow sheet for setting)  Post-op Assessment: Report given to RN and Post -op Vital signs reviewed and stable  Post vital signs: Reviewed and stable  Last Vitals:  Vitals:   02/25/16 1455 02/25/16 1500  BP: (!) 104/55 97/67  Pulse: 86 86  Resp: 12 12  Temp:  36.3 C    Last Pain:  Vitals:   02/25/16 0622  TempSrc: Oral         Complications: No apparent anesthesia complications

## 2016-02-25 NOTE — Anesthesia Procedure Notes (Signed)
Procedure Name: Intubation Date/Time: 02/25/2016 7:49 AM Performed by: Reine JustFLOWERS, Lajoy Vanamburg T Pre-anesthesia Checklist: Patient identified, Emergency Drugs available, Suction available, Patient being monitored and Timeout performed Patient Re-evaluated:Patient Re-evaluated prior to inductionOxygen Delivery Method: Circle system utilized and Simple face mask Preoxygenation: Pre-oxygenation with 100% oxygen Intubation Type: IV induction Ventilation: Mask ventilation without difficulty Laryngoscope Size: Miller and 3 Grade View: Grade I Tube type: Subglottic suction tube Tube size: 8.0 mm Number of attempts: 1 Airway Equipment and Method: Patient positioned with wedge pillow and Stylet Placement Confirmation: ETT inserted through vocal cords under direct vision,  positive ETCO2 and breath sounds checked- equal and bilateral Secured at: 23 cm Tube secured with: Tape Dental Injury: Teeth and Oropharynx as per pre-operative assessment

## 2016-02-25 NOTE — Progress Notes (Signed)
The patient was examined and preop studies reviewed. There has been no change from the prior exam and the patient is ready for surgery.  plan CABG on M Zipp

## 2016-02-25 NOTE — Anesthesia Preprocedure Evaluation (Signed)
Anesthesia Evaluation  Patient identified by MRN, date of birth, ID band Patient awake    Reviewed: Allergy & Precautions, NPO status , Patient's Chart, lab work & pertinent test results  History of Anesthesia Complications Negative for: history of anesthetic complications  Airway Mallampati: III  TM Distance: >3 FB Neck ROM: Full    Dental  (+) Missing   Pulmonary asthma , former smoker,    breath sounds clear to auscultation       Cardiovascular hypertension, Pt. on medications and Pt. on home beta blockers (-) angina+ Past MI and + Cardiac Stents   Rhythm:Regular     Neuro/Psych negative neurological ROS  negative psych ROS   GI/Hepatic negative GI ROS, Neg liver ROS,   Endo/Other  negative endocrine ROS  Renal/GU negative Renal ROS     Musculoskeletal   Abdominal   Peds  Hematology negative hematology ROS (+)   Anesthesia Other Findings   Reproductive/Obstetrics                             Anesthesia Physical Anesthesia Plan  ASA: IV  Anesthesia Plan: General   Post-op Pain Management:    Induction: Intravenous  Airway Management Planned: Oral ETT  Additional Equipment: Arterial line, CVP, PA Cath, Ultrasound Guidance Line Placement and TEE  Intra-op Plan: Utilization Of Total Body Hypothermia per surgeon request  Post-operative Plan: Extubation in OR  Informed Consent: I have reviewed the patients History and Physical, chart, labs and discussed the procedure including the risks, benefits and alternatives for the proposed anesthesia with the patient or authorized representative who has indicated his/her understanding and acceptance.   Dental advisory given  Plan Discussed with: Surgeon and CRNA  Anesthesia Plan Comments:         Anesthesia Quick Evaluation

## 2016-02-26 ENCOUNTER — Inpatient Hospital Stay (HOSPITAL_COMMUNITY): Payer: Medicare Other

## 2016-02-26 LAB — PREPARE FRESH FROZEN PLASMA: Unit division: 0

## 2016-02-26 LAB — BASIC METABOLIC PANEL
ANION GAP: 5 (ref 5–15)
BUN: 11 mg/dL (ref 6–20)
CALCIUM: 8.2 mg/dL — AB (ref 8.9–10.3)
CHLORIDE: 107 mmol/L (ref 101–111)
CO2: 25 mmol/L (ref 22–32)
Creatinine, Ser: 1 mg/dL (ref 0.61–1.24)
GFR calc non Af Amer: >60 mL/min (ref >60)
Glucose, Bld: 122 mg/dL — ABNORMAL HIGH (ref 65–99)
Potassium: 3.9 mmol/L (ref 3.5–5.1)
Sodium: 137 mmol/L (ref 135–145)

## 2016-02-26 LAB — GLUCOSE, CAPILLARY
Glucose-Capillary: 106 mg/dL — ABNORMAL HIGH (ref 65–99)
Glucose-Capillary: 111 mg/dL — ABNORMAL HIGH (ref 65–99)
Glucose-Capillary: 112 mg/dL — ABNORMAL HIGH (ref 65–99)
Glucose-Capillary: 112 mg/dL — ABNORMAL HIGH (ref 65–99)
Glucose-Capillary: 112 mg/dL — ABNORMAL HIGH (ref 65–99)
Glucose-Capillary: 119 mg/dL — ABNORMAL HIGH (ref 65–99)
Glucose-Capillary: 121 mg/dL — ABNORMAL HIGH (ref 65–99)
Glucose-Capillary: 123 mg/dL — ABNORMAL HIGH (ref 65–99)
Glucose-Capillary: 124 mg/dL — ABNORMAL HIGH (ref 65–99)
Glucose-Capillary: 130 mg/dL — ABNORMAL HIGH (ref 65–99)
Glucose-Capillary: 138 mg/dL — ABNORMAL HIGH (ref 65–99)
Glucose-Capillary: 142 mg/dL — ABNORMAL HIGH (ref 65–99)
Glucose-Capillary: 142 mg/dL — ABNORMAL HIGH (ref 65–99)
Glucose-Capillary: 72 mg/dL (ref 65–99)
Glucose-Capillary: 94 mg/dL (ref 65–99)

## 2016-02-26 LAB — CBC
HCT: 34.2 % — ABNORMAL LOW (ref 39.0–52.0)
HEMATOCRIT: 31.7 % — AB (ref 39.0–52.0)
HEMOGLOBIN: 10.4 g/dL — AB (ref 13.0–17.0)
HEMOGLOBIN: 11.1 g/dL — AB (ref 13.0–17.0)
MCH: 29.6 pg (ref 26.0–34.0)
MCH: 29.6 pg (ref 26.0–34.0)
MCHC: 32.8 g/dL (ref 30.0–36.0)
MCHC: 32.5 g/dL (ref 30.0–36.0)
MCV: 90.3 fL (ref 78.0–100.0)
MCV: 91.2 fL (ref 78.0–100.0)
PLATELETS: 167 10*3/uL (ref 150–400)
Platelets: 154 10*3/uL (ref 150–400)
RBC: 3.51 MIL/uL — AB (ref 4.22–5.81)
RBC: 3.75 MIL/uL — AB (ref 4.22–5.81)
RDW: 13.3 % (ref 11.5–15.5)
RDW: 13.5 % (ref 11.5–15.5)
WBC: 11.3 10*3/uL — AB (ref 4.0–10.5)
WBC: 13.7 10*3/uL — AB (ref 4.0–10.5)

## 2016-02-26 LAB — CREATININE, SERUM
CREATININE: 1.08 mg/dL (ref 0.61–1.24)
GFR calc non Af Amer: >60 mL/min (ref >60)

## 2016-02-26 LAB — POCT I-STAT, CHEM 8
BUN: 13 mg/dL (ref 6–20)
Calcium, Ion: 1.23 mmol/L (ref 1.12–1.23)
Chloride: 102 mmol/L (ref 101–111)
Creatinine, Ser: 0.9 mg/dL (ref 0.61–1.24)
Glucose, Bld: 136 mg/dL — ABNORMAL HIGH (ref 65–99)
HCT: 33 % — ABNORMAL LOW (ref 39.0–52.0)
Hemoglobin: 11.2 g/dL — ABNORMAL LOW (ref 13.0–17.0)
Potassium: 3.9 mmol/L (ref 3.5–5.1)
Sodium: 135 mmol/L (ref 135–145)
TCO2: 25 mmol/L (ref 0–100)

## 2016-02-26 LAB — PREPARE PLATELET PHERESIS: Unit division: 0

## 2016-02-26 LAB — MAGNESIUM
Magnesium: 2.4 mg/dL (ref 1.7–2.4)
Magnesium: 2.3 mg/dL (ref 1.7–2.4)

## 2016-02-26 MED ORDER — FUROSEMIDE 10 MG/ML IJ SOLN
20.0000 mg | Freq: Two times a day (BID) | INTRAMUSCULAR | Status: AC
  Administered 2016-02-26 – 2016-02-27 (×4): 20 mg via INTRAVENOUS
  Filled 2016-02-26 (×4): qty 2

## 2016-02-26 MED ORDER — INSULIN DETEMIR 100 UNIT/ML ~~LOC~~ SOLN
14.0000 [IU] | Freq: Two times a day (BID) | SUBCUTANEOUS | Status: DC
  Administered 2016-02-26 – 2016-02-27 (×4): 14 [IU] via SUBCUTANEOUS
  Filled 2016-02-26 (×8): qty 0.14

## 2016-02-26 MED ORDER — CLOPIDOGREL BISULFATE 75 MG PO TABS
75.0000 mg | ORAL_TABLET | Freq: Every day | ORAL | Status: DC
  Administered 2016-02-27 – 2016-02-29 (×3): 75 mg via ORAL
  Filled 2016-02-26 (×3): qty 1

## 2016-02-26 MED ORDER — POTASSIUM CHLORIDE 10 MEQ/50ML IV SOLN
10.0000 meq | INTRAVENOUS | Status: AC
  Administered 2016-02-26 (×2): 10 meq via INTRAVENOUS
  Filled 2016-02-26 (×2): qty 50

## 2016-02-26 MED ORDER — INSULIN ASPART 100 UNIT/ML ~~LOC~~ SOLN
0.0000 [IU] | SUBCUTANEOUS | Status: DC
  Administered 2016-02-26 (×2): 2 [IU] via SUBCUTANEOUS

## 2016-02-26 MED FILL — Sodium Bicarbonate IV Soln 8.4%: INTRAVENOUS | Qty: 50 | Status: AC

## 2016-02-26 MED FILL — Mannitol IV Soln 20%: INTRAVENOUS | Qty: 500 | Status: AC

## 2016-02-26 MED FILL — Albumin, Human Inj 5%: INTRAVENOUS | Qty: 250 | Status: AC

## 2016-02-26 MED FILL — Lidocaine HCl IV Inj 20 MG/ML: INTRAVENOUS | Qty: 5 | Status: AC

## 2016-02-26 MED FILL — Electrolyte-R (PH 7.4) Solution: INTRAVENOUS | Qty: 5000 | Status: AC

## 2016-02-26 MED FILL — Dexmedetomidine HCl in NaCl 0.9% IV Soln 400 MCG/100ML: INTRAVENOUS | Qty: 100 | Status: AC

## 2016-02-26 MED FILL — Heparin Sodium (Porcine) Inj 1000 Unit/ML: INTRAMUSCULAR | Qty: 20 | Status: AC

## 2016-02-26 MED FILL — Sodium Chloride IV Soln 0.9%: INTRAVENOUS | Qty: 2000 | Status: AC

## 2016-02-26 NOTE — Progress Notes (Signed)
1 Day Post-Op Procedure(s) (LRB): CORONARY ARTERY BYPASS GRAFTING (CABG)X4  LIMA-LAD; SEQ SVG-OM1-OM2; SVG-PD ENDOSCOPIC GREATER SAPHENOUS VEIN HARVEST(EVH) RIGHT LEG (N/A) INTRAOPERATIVE TRANSESOPHAGEAL ECHOCARDIOGRAM (N/A) Subjective: Feels well this am Nsr, off drips except insulin  Objective: Vital signs in last 24 hours: Temp:  [96.8 F (36 C)-97.9 F (36.6 C)] 97.9 F (36.6 C) (08/16 0700) Pulse Rate:  [66-86] 75 (08/16 0800) Cardiac Rhythm: Normal sinus rhythm (08/16 0800) Resp:  [8-24] 21 (08/16 0800) BP: (86-118)/(54-82) 115/76 (08/16 0800) SpO2:  [93 %-100 %] 95 % (08/16 0800) Arterial Line BP: (79-129)/(52-92) 91/74 (08/16 0300) FiO2 (%):  [40 %-50 %] 40 % (08/15 1800) Weight:  [241 lb 6.5 oz (109.5 kg)] 241 lb 6.5 oz (109.5 kg) (08/16 0500)  Hemodynamic parameters for last 24 hours: PAP: (18-37)/(10-23) 32/23 CO:  [5.7 L/min-8.1 L/min] 6.3 L/min CI:  [2.5 L/min/m2-3.5 L/min/m2] 2.7 L/min/m2  Intake/Output from previous day: 08/15 0701 - 08/16 0700 In: 6985.6 [I.V.:4268.6; Blood:1017; NG/GT:30; IV Piggyback:1650] Out: 5265 [Urine:2675; Emesis/NG output:50; Blood:1900; Chest Tube:640] Intake/Output this shift: Total I/O In: 50 [I.V.:50] Out: 60 [Urine:60]         Exam    General- alert and comfortable   Lungs- clear without rales, wheezes   Cor- regular rate and rhythm, no murmur , gallop   Abdomen- soft, non-tender   Extremities - warm, non-tender, minimal edema   Neuro- oriented, appropriate, no focal weakness  Lab Results:  Recent Labs  02/25/16 2055 02/25/16 2103 02/26/16 0400  WBC 10.5  --  11.3*  HGB 10.0* 10.2* 10.4*  HCT 30.8* 30.0* 31.7*  PLT 136*  --  154   BMET:  Recent Labs  02/25/16 2103 02/26/16 0400  NA 139 137  K 3.9 3.9  CL 103 107  CO2  --  25  GLUCOSE 156* 122*  BUN 11 11  CREATININE 0.90 1.00  CALCIUM  --  8.2*    PT/INR:  Recent Labs  02/25/16 1531  LABPROT 18.3*  INR 1.51   ABG    Component Value  Date/Time   PHART 7.357 02/25/2016 1942   HCO3 22.5 02/25/2016 1942   TCO2 23 02/25/2016 2103   ACIDBASEDEF 3.0 (H) 02/25/2016 1942   O2SAT 97.0 02/25/2016 1942   CBG (last 3)   Recent Labs  02/26/16 0506 02/26/16 0621 02/26/16 0714  GLUCAP 72 142* 138*    Assessment/Plan: S/P Procedure(s) (LRB): CORONARY ARTERY BYPASS GRAFTING (CABG)X4  LIMA-LAD; SEQ SVG-OM1-OM2; SVG-PD ENDOSCOPIC GREATER SAPHENOUS VEIN HARVEST(EVH) RIGHT LEG (N/A) INTRAOPERATIVE TRANSESOPHAGEAL ECHOCARDIOGRAM (N/A) Mobilize Diuresis Diabetes control d/c tubes/lines See progression orders bid levemir for glucose control   LOS: 1 day    Kathlee Nationseter Van Trigt III 02/26/2016

## 2016-02-26 NOTE — Progress Notes (Signed)
Patient ID: Dalphine HandingMoodie F Esquivel, male   DOB: 11/19/1945, 70 y.o.   MRN: [id number] EVENING ROUNDS NOTE :     [address]       [place] [id number]             [phone]                 1 Day Post-Op Procedure(s) (LRB): CORONARY ARTERY BYPASS GRAFTING (CABG)X4  LIMA-LAD; SEQ SVG-OM1-OM2; SVG-PD ENDOSCOPIC GREATER SAPHENOUS VEIN HARVEST(EVH) RIGHT LEG (N/A) INTRAOPERATIVE TRANSESOPHAGEAL ECHOCARDIOGRAM (N/A)  Total Length of Stay:  LOS: 1 day  BP 129/69   Pulse 91   Temp 99.7 F (37.6 C) (Oral)   Resp 18   Ht 6\' 2"  (1.88 m)   Wt 241 lb 6.5 oz (109.5 kg)   SpO2 97%   BMI 30.99 kg/m   .Intake/Output      08/15 0701 - 08/16 0700 08/16 0701 - 08/17 0700   P.O. 0    I.V. (mL/kg) 4268.6 (39) 230 (2.1)   Blood 1017    Other 20    NG/GT 30    IV Piggyback 1650 150   Total Intake(mL/kg) 6985.6 (63.8) 380 (3.5)   Urine (mL/kg/hr) 2675 (1) 585 (0.5)   Emesis/NG output 50 (0)    Blood 1900 (0.7)    Chest Tube 640 (0.2) 70 (0.1)   Total Output 5265 655   Net +1720.6 -275          . sodium chloride Stopped (02/26/16 1000)  . sodium chloride    . sodium chloride 10 mL/hr at 02/26/16 0800  . insulin (NOVOLIN-R) infusion 3.1 Units/hr (02/26/16 1048)  . lactated ringers Stopped (02/25/16 2300)  . lactated ringers 20 mL/hr at 02/26/16 1100  . phenylephrine (NEO-SYNEPHRINE) Adult infusion Stopped (02/25/16 2015)     Lab Results  Component Value Date   WBC 13.7 (H) 02/26/2016   HGB 11.2 (L) 02/26/2016   HCT 33.0 (L) 02/26/2016   PLT 167 02/26/2016   GLUCOSE 136 (H) 02/26/2016   CHOL 228 (H) 01/11/2016   TRIG 116 01/11/2016   HDL 48 01/11/2016   LDLCALC 157 (H) 01/11/2016   ALT 33 02/21/2016   AST 31 02/21/2016   NA 135 02/26/2016   K 3.9 02/26/2016   CL 102 02/26/2016   CREATININE 0.90 02/26/2016   BUN 13 02/26/2016   CO2 25 02/26/2016   INR 1.51 02/25/2016   HGBA1C 5.8 (H) 02/21/2016   Stable day Tubes and lines are out , foley in   sinus  Delight OvensEdward B Brevyn Ring MD  Beeper [phone] Office [phone] 02/26/2016 6:23 PM

## 2016-02-27 ENCOUNTER — Encounter (HOSPITAL_COMMUNITY): Payer: Self-pay | Admitting: Cardiothoracic Surgery

## 2016-02-27 ENCOUNTER — Inpatient Hospital Stay (HOSPITAL_COMMUNITY): Payer: Medicare Other

## 2016-02-27 LAB — BASIC METABOLIC PANEL
Anion gap: 8 (ref 5–15)
BUN: 9 mg/dL (ref 6–20)
CO2: 24 mmol/L (ref 22–32)
Calcium: 8.5 mg/dL — ABNORMAL LOW (ref 8.9–10.3)
Chloride: 104 mmol/L (ref 101–111)
Creatinine, Ser: 0.96 mg/dL (ref 0.61–1.24)
GFR calc Af Amer: >60 mL/min (ref >60)
GFR calc non Af Amer: >60 mL/min (ref >60)
Glucose, Bld: 121 mg/dL — ABNORMAL HIGH (ref 65–99)
Potassium: 3.9 mmol/L (ref 3.5–5.1)
Sodium: 136 mmol/L (ref 135–145)

## 2016-02-27 LAB — CBC
HCT: 34 % — ABNORMAL LOW (ref 39.0–52.0)
Hemoglobin: 11.2 g/dL — ABNORMAL LOW (ref 13.0–17.0)
MCH: 30.1 pg (ref 26.0–34.0)
MCHC: 32.9 g/dL (ref 30.0–36.0)
MCV: 91.4 fL (ref 78.0–100.0)
Platelets: 156 10*3/uL (ref 150–400)
RBC: 3.72 MIL/uL — ABNORMAL LOW (ref 4.22–5.81)
RDW: 13.6 % (ref 11.5–15.5)
WBC: 14.5 10*3/uL — ABNORMAL HIGH (ref 4.0–10.5)

## 2016-02-27 LAB — GLUCOSE, CAPILLARY
Glucose-Capillary: 107 mg/dL — ABNORMAL HIGH (ref 65–99)
Glucose-Capillary: 139 mg/dL — ABNORMAL HIGH (ref 65–99)
Glucose-Capillary: 149 mg/dL — ABNORMAL HIGH (ref 65–99)
Glucose-Capillary: 123 mg/dL — ABNORMAL HIGH (ref 65–99)
Glucose-Capillary: 129 mg/dL — ABNORMAL HIGH (ref 65–99)

## 2016-02-27 MED ORDER — MOVING RIGHT ALONG BOOK
Freq: Once | Status: AC
  Administered 2016-02-27: 08:00:00
  Filled 2016-02-27: qty 1

## 2016-02-27 MED ORDER — INSULIN ASPART 100 UNIT/ML ~~LOC~~ SOLN
0.0000 [IU] | Freq: Three times a day (TID) | SUBCUTANEOUS | Status: DC
  Administered 2016-02-27 (×2): 2 [IU] via SUBCUTANEOUS

## 2016-02-27 MED ORDER — POTASSIUM CHLORIDE 10 MEQ/50ML IV SOLN: 10.0000 meq | INTRAVENOUS | Status: DC

## 2016-02-27 MED ORDER — SODIUM CHLORIDE 0.9 % IV SOLN: 250.0000 mL | INTRAVENOUS | Status: DC | PRN

## 2016-02-27 MED ORDER — SODIUM CHLORIDE 0.9% FLUSH
3.0000 mL | Freq: Two times a day (BID) | INTRAVENOUS | Status: DC
  Administered 2016-02-28 – 2016-02-29 (×3): 3 mL via INTRAVENOUS

## 2016-02-27 MED ORDER — PNEUMOCOCCAL VAC POLYVALENT 25 MCG/0.5ML IJ INJ
0.5000 mL | INJECTION | INTRAMUSCULAR | Status: AC
  Administered 2016-02-28: 0.5 mL via INTRAMUSCULAR
  Filled 2016-02-27: qty 0.5

## 2016-02-27 MED ORDER — INSULIN ASPART 100 UNIT/ML ~~LOC~~ SOLN: 0.0000 [IU] | Freq: Three times a day (TID) | SUBCUTANEOUS | Status: DC

## 2016-02-27 MED ORDER — GUAIFENESIN-DM 100-10 MG/5ML PO SYRP
15.0000 mL | ORAL_SOLUTION | ORAL | Status: DC | PRN
  Administered 2016-02-27 – 2016-02-29 (×5): 15 mL via ORAL
  Filled 2016-02-27 (×6): qty 15

## 2016-02-27 MED ORDER — SODIUM CHLORIDE 0.9% FLUSH: 3.0000 mL | INTRAVENOUS | Status: DC | PRN

## 2016-02-27 MED ORDER — POTASSIUM CHLORIDE CRYS ER 20 MEQ PO TBCR
20.0000 meq | EXTENDED_RELEASE_TABLET | Freq: Once | ORAL | Status: AC
  Administered 2016-02-27: 20 meq via ORAL
  Filled 2016-02-27: qty 1

## 2016-02-27 MED ORDER — ASPIRIN EC 81 MG PO TBEC
81.0000 mg | DELAYED_RELEASE_TABLET | Freq: Every day | ORAL | Status: DC
  Administered 2016-02-28 – 2016-02-29 (×2): 81 mg via ORAL
  Filled 2016-02-27 (×2): qty 1

## 2016-02-27 MED ORDER — FUROSEMIDE 40 MG PO TABS
40.0000 mg | ORAL_TABLET | Freq: Every day | ORAL | Status: DC
  Administered 2016-02-28 – 2016-02-29 (×2): 40 mg via ORAL
  Filled 2016-02-27 (×2): qty 1

## 2016-02-27 NOTE — Progress Notes (Signed)
2 Days Post-Op Procedure(s) (LRB): CORONARY ARTERY BYPASS GRAFTING (CABG)X4  LIMA-LAD; SEQ SVG-OM1-OM2; SVG-PD ENDOSCOPIC GREATER SAPHENOUS VEIN HARVEST(EVH) RIGHT LEG (N/A) INTRAOPERATIVE TRANSESOPHAGEAL ECHOCARDIOGRAM (N/A) Subjective: Continues to progress Ambulating in hallway Maintaining sinus rhythm  Objective: Vital signs in last 24 hours: Temp:  [98.2 F (36.8 C)-99.4 F (37.4 C)] 98.2 F (36.8 C) (08/17 1102) Pulse Rate:  [74-94] 90 (08/17 1102) Cardiac Rhythm: Normal sinus rhythm;Bundle branch block (08/17 1104) Resp:  [12-21] 18 (08/17 1102) BP: (105-147)/(63-86) 105/74 (08/17 1102) SpO2:  [93 %-97 %] 97 % (08/17 1102) Weight:  [238 lb 12.1 oz (108.3 kg)] 238 lb 12.1 oz (108.3 kg) (08/17 0500)  Hemodynamic parameters for last 24 hours:  sinus rhythm afebrile  Intake/Output from previous day: 08/16 0701 - 08/17 0700 In: 630 [I.V.:430; IV Piggyback:200] Out: 2090 [Urine:2020; Chest Tube:70] Intake/Output this shift: Total I/O In: -  Out: 390 [Urine:390]       Exam    General- alert and comfortable   Lungs- clear without rales, wheezes   Cor- regular rate and rhythm, no murmur , gallop   Abdomen- soft, non-tender   Extremities - warm, non-tender, minimal edema   Neuro- oriented, appropriate, no focal weakness   Lab Results:  Recent Labs  02/26/16 1615 02/26/16 1619 02/27/16 0434  WBC 13.7*  --  14.5*  HGB 11.1* 11.2* 11.2*  HCT 34.2* 33.0* 34.0*  PLT 167  --  156   BMET:  Recent Labs  02/26/16 0400  02/26/16 1619 02/27/16 0434  NA 137  --  135 136  K 3.9  --  3.9 3.9  CL 107  --  102 104  CO2 25  --   --  24  GLUCOSE 122*  --  136* 121*  BUN 11  --  13 9  CREATININE 1.00  < > 0.90 0.96  CALCIUM 8.2*  --   --  8.5*  < > = values in this interval not displayed.  PT/INR:  Recent Labs  02/25/16 1531  LABPROT 18.3*  INR 1.51   ABG    Component Value Date/Time   PHART 7.357 02/25/2016 1942   HCO3 22.5 02/25/2016 1942   TCO2 25  02/26/2016 1619   ACIDBASEDEF 3.0 (H) 02/25/2016 1942   O2SAT 97.0 02/25/2016 1942   CBG (last 3)   Recent Labs  02/27/16 0347 02/27/16 0801 02/27/16 1114  GLUCAP 107* 139* 149*    Assessment/Plan: S/P Procedure(s) (LRB): CORONARY ARTERY BYPASS GRAFTING (CABG)X4  LIMA-LAD; SEQ SVG-OM1-OM2; SVG-PD ENDOSCOPIC GREATER SAPHENOUS VEIN HARVEST(EVH) RIGHT LEG (N/A) INTRAOPERATIVE TRANSESOPHAGEAL ECHOCARDIOGRAM (N/A) Mobilize Diuresis Diabetes control d/c tubes/lines Plan for transfer to step-down: see transfer orders   LOS: 2 days    Kathlee Nationseter Van Trigt III 02/27/2016

## 2016-02-27 NOTE — Anesthesia Postprocedure Evaluation (Signed)
Anesthesia Post Note  Patient: Bobby Price  Procedure(s) Performed: Procedure(s) (LRB): CORONARY ARTERY BYPASS GRAFTING (CABG)X4  LIMA-LAD; SEQ SVG-OM1-OM2; SVG-PD ENDOSCOPIC GREATER SAPHENOUS VEIN HARVEST(EVH) RIGHT LEG (N/A) INTRAOPERATIVE TRANSESOPHAGEAL ECHOCARDIOGRAM (N/A)  Patient location during evaluation: ICU Anesthesia Type: General Level of consciousness: sedated Pain management: pain level controlled Vital Signs Assessment: post-procedure vital signs reviewed and stable Respiratory status: patient remains intubated per anesthesia plan Cardiovascular status: stable Postop Assessment: no signs of nausea or vomiting Anesthetic complications: no    Last Vitals:  Vitals:   02/27/16 1000 02/27/16 1102  BP: 119/74 105/74  Pulse: 91 90  Resp: 15 18  Temp:  36.8 C    Last Pain:  Vitals:   02/27/16 1102  TempSrc: Oral  PainSc:                  Iyania Denne

## 2016-02-27 NOTE — Progress Notes (Signed)
CARDIAC REHAB PHASE I   PRE:  Rate/Rhythm: 80 SR    BP: sitting 112/72    SaO2: 97 RA  MODE:  Ambulation: 550 ft   POST:  Rate/Rhythm: 103 ST    BP: sitting 138/99     SaO2: 97 RA  Tolerated well. Pt able to stand independently and walk with RW. No c/o, sts it feels good to walk. Return to recliner. Will f/u tomorrow. This was third walk. [phone]   Harriet MassonRandi Kristan Lavon Horn CES, ACSM 02/27/2016 2:20 PM

## 2016-02-28 ENCOUNTER — Inpatient Hospital Stay (HOSPITAL_COMMUNITY): Payer: Medicare Other

## 2016-02-28 DIAGNOSIS — Z951 Presence of aortocoronary bypass graft: Secondary | ICD-10-CM

## 2016-02-28 DIAGNOSIS — E785 Hyperlipidemia, unspecified: Secondary | ICD-10-CM

## 2016-02-28 DIAGNOSIS — I10 Essential (primary) hypertension: Secondary | ICD-10-CM

## 2016-02-28 LAB — BASIC METABOLIC PANEL
Anion gap: 8 (ref 5–15)
BUN: 13 mg/dL (ref 6–20)
CO2: 23 mmol/L (ref 22–32)
Calcium: 8.7 mg/dL — ABNORMAL LOW (ref 8.9–10.3)
Chloride: 105 mmol/L (ref 101–111)
Creatinine, Ser: 0.96 mg/dL (ref 0.61–1.24)
GFR calc Af Amer: >60 mL/min (ref >60)
GFR calc non Af Amer: >60 mL/min (ref >60)
Glucose, Bld: 102 mg/dL — ABNORMAL HIGH (ref 65–99)
Potassium: 3.8 mmol/L (ref 3.5–5.1)
Sodium: 136 mmol/L (ref 135–145)

## 2016-02-28 LAB — GLUCOSE, CAPILLARY
Glucose-Capillary: 97 mg/dL (ref 65–99)
Glucose-Capillary: 120 mg/dL — ABNORMAL HIGH (ref 65–99)
Glucose-Capillary: 97 mg/dL (ref 65–99)
Glucose-Capillary: 94 mg/dL (ref 65–99)

## 2016-02-28 LAB — CBC
HCT: 33.5 % — ABNORMAL LOW (ref 39.0–52.0)
Hemoglobin: 10.8 g/dL — ABNORMAL LOW (ref 13.0–17.0)
MCH: 29.4 pg (ref 26.0–34.0)
MCHC: 32.2 g/dL (ref 30.0–36.0)
MCV: 91.3 fL (ref 78.0–100.0)
Platelets: 156 10*3/uL (ref 150–400)
RBC: 3.67 MIL/uL — ABNORMAL LOW (ref 4.22–5.81)
RDW: 13.5 % (ref 11.5–15.5)
WBC: 12.9 10*3/uL — ABNORMAL HIGH (ref 4.0–10.5)

## 2016-02-28 MED ORDER — METOPROLOL TARTRATE 25 MG/10 ML ORAL SUSPENSION: 12.5000 mg | Freq: Two times a day (BID) | ORAL | Status: DC

## 2016-02-28 MED ORDER — METOPROLOL TARTRATE 25 MG PO TABS
25.0000 mg | ORAL_TABLET | Freq: Two times a day (BID) | ORAL | Status: DC
  Administered 2016-02-28 – 2016-02-29 (×3): 25 mg via ORAL
  Filled 2016-02-28 (×3): qty 1

## 2016-02-28 NOTE — Care Management Important Message (Signed)
Important Message  Patient Details  Name: Bobby [name] MRN: [id number] Date of Birth: 1945-08-11   Medicare Important Message Given:  Yes    Kyla BalzarineShealy, Annalis Kaczmarczyk Abena 02/28/2016, 10:40 AM

## 2016-02-28 NOTE — Progress Notes (Signed)
    Subjective:  Feeling better today. Standing up at sink.   Objective:  Vital Signs in the last 24 hours: Temp:  [97.7 F (36.5 C)-98.9 F (37.2 C)] 97.7 F (36.5 C) (08/18 0543) Pulse Rate:  [87-90] 87 (08/18 0543) Resp:  [18] 18 (08/18 0543) BP: (116-120)/(78-80) 116/80 (08/18 0543) SpO2:  [96 %-97 %] 97 % (08/18 0543) Weight:  [107.1 kg (236 lb 3.2 oz)] 107.1 kg (236 lb 3.2 oz) (08/18 0543)  Intake/Output from previous day: 08/17 0701 - 08/18 0700 In: 360 [P.O.:360] Out: 1440 [Urine:1440]  Physical Exam: Pt is alert and oriented, NAD HEENT: normal Neck: JVP - normal Lungs: CTA bilaterally CV: RRR  Abd: soft, NT, Positive BS, no hepatomegaly Ext: 1+ pretibial edema R > L Skin: warm/dry no rash   Lab Results:  Recent Labs  02/27/16 0434 02/28/16 0341  WBC 14.5* 12.9*  HGB 11.2* 10.8*  PLT 156 156    Recent Labs  02/27/16 0434 02/28/16 0341  NA 136 136  K 3.9 3.8  CL 104 105  CO2 24 23  GLUCOSE 121* 102*  BUN 9 13  CREATININE 0.96 0.96   No results for input(s): TROPONINI in the last 72 hours.  Invalid input(s): CK, MB  Tele: Normal sinus rhythm  Assessment/Plan:  S/P CABG, POD #3. Pt progressing well. Maintaining sinus rhythm. Plan to remove epicardial wires tomorrow per TCTS team. ASA/plavix, metoprolol, and atorvastatin. Continue same Rx. Will arrange cardiology follow-up as outpatient.    Tonny Bollmanooper, Eyvonne Burchfield, M.D. 02/28/2016, 1:13 PM

## 2016-02-28 NOTE — Progress Notes (Signed)
Per Dr. Donata ClayVan Trigt:  do not remove epicardial wires today. Dr. Donata ClayVan Trigt wants removed tomorrow 02/29/16.

## 2016-02-28 NOTE — Discharge Summary (Signed)
Physician Discharge Summary  Patient ID: Bobby [name] MRN: [id number] DOB/AGE: Aug 15, 1945 70 y.o.  Admit date: 02/25/2016 Discharge date: 02/29/2016  Admission Diagnoses:  Discharge Diagnoses:  Active Problems:   S/P CABG x 4   Essential hypertension   Hyperlipidemia   Patient Active Problem List   Diagnosis Date Noted  . Essential hypertension 02/28/2016  . Hyperlipidemia 02/28/2016  . S/P CABG x 4 02/25/2016  . NSTEMI (non-ST elevated myocardial infarction) (HCC)      History of Present Illness:  at time of consultation Patient is a 70 year old Caucasian male nondiabetic reformed smoker who presented in early July with acute chest pain and positive cardiac enzymes and underwent urgent catheterization. This demonstrated a totally occluded RCA with significant left main and multivessel CAD as well. The patient underwent a bare-metal stent to reperfuse the RCA with good result. The plan was to allow the patient to recover from the RCA occlusion-MI and proceed with multivessel CABG after Brilinta could be safely stopped one month after PCI.  The patient has done very well since his PCI. No recurrent chest pain or symptoms of CHF. His activity level has been reduced. He recently developed sinus congestion and a cough which is now improving. No bleeding complications noted from the Brilinta.  The patient has a fairly benign medical history without previous hospitalizations or major surgery. He is right-hand dominant and denies allergies. No history of thoracic trauma. The patient did have a large soft tissue incision by his right knee as a child in the area of the greater saphenous vein. This required sutures in the emergency room.  While the patient was hospitalized he had echocardiogram which showed normal LV function no significant valvular abnormalities  Discharged Condition: good  Hospital Course:The patient was admitted for the procedure. On 02/25/2016 he was taken to the  operating room where he underwent the below described procedure. He tolerated well was taken to the surgical intensive care unit in stable condition.  Post operative Hospital course: Overall the patient has progressed quite nicely. He maintained stable hemodynamics. All routine lines, monitors and drainage devices have been discontinued in the standard fashion. He has some postoperative volume overload but has responded well to diuretics. Diabetes has been controlled using standard protocols. He is mobilized using standard cardiac rehabilitation modalities. He has maintained sinus rhythm. He is a mild postoperative acute blood loss anemia which is stable. Oxygen has been weaned and he maintains good saturations on room air. Incisions are healing well without evidence of infection. At time of discharge she is felt to be quite stable.   Consults: cardiology  Significant Diagnostic Studies: angiography: cardiac cath  Treatments: surgery:  DATE OF PROCEDURE:  02/25/2016 DATE OF DISCHARGE:                              OPERATIVE REPORT   OPERATION: 1. Coronary artery bypass grafting x4 (left internal mammary artery to     LAD, saphenous vein graft to posterior descending, sequential     saphenous vein graft to OM1 and OM2). 2. Endoscopic harvest of right leg greater saphenous vein.  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  SURGEON:  Kerin PernaPeter Van Trigt, M.D.  ASSISTANT:  Jari Favreessa Conte, PA-C, and Rowe ClackWayne E. Alivea Gladson, P.A.-C.  ANESTHESIA:  General by Dr. Val Eaglehristopher Moser.  Discharge Exam: Blood pressure 134/79, pulse 80, temperature 97.9 F (36.6 C), temperature source Oral, resp. rate 18, height 6\' 2"  (1.88 m), weight 233 lb 4 oz (105.8 kg), SpO2 98 %.  General appearance: alert, cooperative and no distress Heart:  regular rate and rhythm Lungs: clear to auscultation bilaterally Abdomen: benign Extremities: no edema Wound: incis healing well Disposition: 01-Home or Self Care     Medication List    STOP taking these medications   nitroGLYCERIN 0.4 MG SL tablet Commonly known as:  NITROSTAT   ticagrelor 90 MG Tabs tablet Commonly known as:  BRILINTA     TAKE these medications   aspirin 81 MG chewable tablet Chew 1 tablet (81 mg total) by mouth daily.   atorvastatin 80 MG tablet Commonly known as:  LIPITOR Take 1 tablet (80 mg total) by mouth daily at 6 PM.   clopidogrel 75 MG tablet Commonly known as:  PLAVIX Take 1 tablet (75 mg total) by mouth daily.   lisinopril 5 MG tablet Commonly known as:  PRINIVIL,ZESTRIL Take 1 tablet (5 mg total) by mouth daily.   metoprolol tartrate 25 MG tablet Commonly known as:  LOPRESSOR Take 1 tablet (25 mg total) by mouth 2 (two) times daily.   oxyCODONE 5 MG immediate release tablet Commonly known as:  Oxy IR/ROXICODONE Take 1-2 tablets (5-10 mg total) by mouth every 6 (six) hours as needed for severe pain.   Travoprost (BAK Free) 0.004 % Soln ophthalmic solution Commonly known as:  TRAVATAN Place 1 drop into both eyes daily as needed (spots in front of eyes).      Follow-up Information    Kathlee NationsPeter Van Suann Larryrigt III, MD .   Specialty:  Cardiothoracic Surgery Why:  Appointment see the surgeon on 03/25/2016 at 1 PM. Please obtain a chest x-ray and [place] imaging at 12:30 PM. [facility] [place] [facility] is located on the first floor in the same office complex. Contact information: [address] [place] [address] [phone]        Cline CrockKathryn Thompson, PA-C .   Specialties:  Cardiology, Radiology Why:  sept 1, 2017 at 2:30 pm for cardiology follow-up Contact information: [address] [place] [address] [phone]          The patient has been discharged on:   1.Beta Blocker:  Yes [   ]                               No   [   ]                              If No, reason:  2.Ace Inhibitor/ARB: Yes [ y  ]                                     No  [   ]                                     If No, reason: 3.Statin:   Yes [place][y   ]                  No  [   ]  If No, reason:  4.Ecasa:  Yes  [ y  ]                  No   [   ]                  If No, reason:  Signed: Shalena Ezzell E 02/29/2016, 8:48 AM

## 2016-02-28 NOTE — Progress Notes (Signed)
[address]       [place] [id number]             [phone]      3 Days Post-Op Procedure(s) (LRB): CORONARY ARTERY BYPASS GRAFTING (CABG)X4  LIMA-LAD; SEQ SVG-OM1-OM2; SVG-PD ENDOSCOPIC GREATER SAPHENOUS VEIN HARVEST(EVH) RIGHT LEG (N/A) INTRAOPERATIVE TRANSESOPHAGEAL ECHOCARDIOGRAM (N/A) Subjective: Feels well   Objective: Vital signs in last 24 hours: Temp:  [97.7 F (36.5 C)-98.9 F (37.2 C)] 97.7 F (36.5 C) (08/18 0543) Pulse Rate:  [87-91] 87 (08/18 0543) Cardiac Rhythm: Normal sinus rhythm (08/17 2045) Resp:  [15-18] 18 (08/18 0543) BP: (105-120)/(70-80) 116/80 (08/18 0543) SpO2:  [95 %-97 %] 97 % (08/18 0543) Weight:  [236 lb 3.2 oz (107.1 kg)] 236 lb 3.2 oz (107.1 kg) (08/18 0543)  Hemodynamic parameters for last 24 hours:    Intake/Output from previous day: 08/17 0701 - 08/18 0700 In: 360 [P.O.:360] Out: 1440 [Urine:1440] Intake/Output this shift: Total I/O In: -  Out: 200 [Urine:200]  General appearance: alert, cooperative and no distress Heart: regular rate and rhythm Lungs: clear to auscultation bilaterally Abdomen: benign Extremities: + edema Wound: incis healing well  Lab Results:  Recent Labs  02/27/16 0434 02/28/16 0341  WBC 14.5* 12.9*  HGB 11.2* 10.8*  HCT 34.0* 33.5*  PLT 156 156   BMET:  Recent Labs  02/27/16 0434 02/28/16 0341  NA 136 136  K 3.9 3.8  CL 104 105  CO2 24 23  GLUCOSE 121* 102*  BUN 9 13  CREATININE 0.96 0.96  CALCIUM 8.5* 8.7*    PT/INR:  Recent Labs  02/25/16 1531  LABPROT 18.3*  INR 1.51   ABG    Component Value Date/Time   PHART 7.357 02/25/2016 1942   HCO3 22.5 02/25/2016 1942   TCO2 25 02/26/2016 1619   ACIDBASEDEF 3.0 (H) 02/25/2016 1942   O2SAT 97.0 02/25/2016 1942   CBG (last 3)   Recent Labs  02/27/16 1611 02/27/16 2125 02/28/16 0651  GLUCAP 123* 129* 97    Meds Scheduled Meds: . acetaminophen  1,000 mg Oral Q6H   Or  . acetaminophen (TYLENOL)  oral liquid 160 mg/5 mL  1,000 mg Per Tube Q6H  . aminocaproic acid (AMICAR) IVPB  5 g Intravenous Once  . aspirin EC  81 mg Oral Daily  . atorvastatin  80 mg Oral q1800  . bisacodyl  10 mg Oral Daily   Or  . bisacodyl  10 mg Rectal Daily  . clopidogrel  75 mg Oral Daily  . docusate sodium  200 mg Oral Daily  . furosemide  40 mg Oral Daily  . insulin aspart  0-24 Units Subcutaneous TID AC & HS  . insulin detemir  14 Units Subcutaneous BID  . latanoprost  1 drop Both Eyes QHS  . metoprolol tartrate  12.5 mg Oral BID   Or  . metoprolol tartrate  12.5 mg Per Tube BID  . pantoprazole  40 mg Oral Daily  . pneumococcal 23 valent vaccine  0.5 mL Intramuscular Tomorrow-1000  . sodium chloride flush  3 mL Intravenous Q12H  . sodium chloride flush  3 mL Intravenous Q12H   Continuous Infusions: . sodium chloride Stopped (02/26/16 1000)  . sodium chloride    . sodium chloride 10 mL/hr at 02/26/16 0800  . insulin (NOVOLIN-R) infusion 3.1 Units/hr (02/26/16 1048)  . lactated ringers Stopped (02/25/16 2300)  . lactated ringers Stopped (02/27/16 0400)  . phenylephrine (NEO-SYNEPHRINE) Adult infusion Stopped (02/25/16  2015)   PRN Meds:.sodium chloride, sodium chloride, guaiFENesin-dextromethorphan, metoprolol, morphine injection, ondansetron (ZOFRAN) IV, oxyCODONE, sodium chloride flush, sodium chloride flush, traMADol  Xrays Dg Chest 2 View  Result Date: 02/28/2016 CLINICAL DATA:  CABG. EXAM: CHEST  2 VIEW COMPARISON:  02/27/2016.  02/21/2016.  01/11/2016. FINDINGS: Prior CABG. Cardiomegaly with normal pulmonary vascularity. Low lung volumes with mild bibasilar atelectasis. Small bilateral pleural effusions. IMPRESSION: 1. Questionable mild infiltrate left mid lung. Follow-up chest x-rays recommended demonstrate complete clearing. 2. Prior CABG. Cardiomegaly. No pulmonary venous congestion . Low lung volumes with bibasilar subsegment atelectasis. Small bilateral pleural effusions. Electronically  Signed   By: Maisie Fushomas  Register   On: 02/28/2016 07:36   Dg Chest Port 1 View  Result Date: 02/27/2016 CLINICAL DATA:  Sore chest.  CABG. EXAM: PORTABLE CHEST 1 VIEW COMPARISON:  02/26/2016. FINDINGS: Interim removal Swan-Ganz catheter, mediastinal drainage catheter, left chest tube. Prior CABG. Stable cardiomegaly. Persistent low lung volumes with mild bibasilar and left mid lung subsegmental atelectasis and or mild infiltrate/edema. Small bilateral pleural effusions. Apical pleural thickening noted consistent scarring. IMPRESSION: 1. Interim removal of Swan-Ganz catheter, mediastinal drainage catheter, and left chest tube. No pneumothorax . 2. Prior CABG.  Stable cardiomegaly. 3. Persistent low lung volumes with mild bibasilar and left mid lung field subsegmental atelectasis and/or infiltrates/edema. Small bilateral pleural effusions . Electronically Signed   By: Maisie Fushomas  Register   On: 02/27/2016 07:31    Assessment/Plan: S/P Procedure(s) (LRB): CORONARY ARTERY BYPASS GRAFTING (CABG)X4  LIMA-LAD; SEQ SVG-OM1-OM2; SVG-PD ENDOSCOPIC GREATER SAPHENOUS VEIN HARVEST(EVH) RIGHT LEG (N/A) INTRAOPERATIVE TRANSESOPHAGEAL ECHOCARDIOGRAM (N/A)  1 doing very well 2 BP a bit low to start ace inhib, frequent pvc's will increase beta blocker though 3 labs stable 4 d/cepw's 5 poss home in am 6 cont gentle diuresis   LOS: 3 days    [name],[name] 02/28/2016

## 2016-02-28 NOTE — Progress Notes (Signed)
CARDIAC REHAB PHASE I   PRE:  Rate/Rhythm: 96 SR  BP:  Sitting: 123/81        SaO2: 98 RA  MODE:  Ambulation: 550 ft   POST:  Rate/Rhythm: 117 ST  BP:  Sitting: 123/73         SaO2: 99 RA  Pt ambulated 550 ft on RA, rolling walker, assist x1, fairly independent, steady gait, tolerated well with no complaints. Encouraged additional ambulation x2 today. Pt receptive. Pt to recliner after walk, call bell within reach. Will follow.   [phone] Joylene GrapesEmily C Mande Auvil, RN, BSN 02/28/2016 9:45 AM

## 2016-02-28 NOTE — Discharge Instructions (Signed)
Endoscopic Saphenous Vein Harvesting, Care After °Refer to this sheet in the next few weeks. These instructions provide you with information on caring for yourself after your procedure. Your health care provider may also give you more specific instructions. Your treatment has been planned according to current medical practices, but problems sometimes occur. Call your health care provider if you have any problems or questions after your procedure. °HOME CARE INSTRUCTIONS °Medicine °· Take whatever pain medicine your surgeon prescribes. Follow the directions carefully. Do not take over-the-counter pain medicine unless your surgeon says it is okay. Some pain medicine can cause bleeding problems for several weeks after surgery. °· Follow your surgeon's instructions about driving. You will probably not be permitted to drive after heart surgery. °· Take any medicines your surgeon prescribes. Any medicines you took before your heart surgery should be checked with your health care provider before you start taking them again. °Wound care °· If your surgeon has prescribed an elastic bandage or stocking, ask how long you should wear it. °· Check the area around your surgical cuts (incisions) whenever your bandages (dressings) are changed. Look for any redness or swelling. °· You will need to return to have the stitches (sutures) or staples taken out. Ask your surgeon when to do that. °· Ask your surgeon when you can shower or bathe. °Activity °· Try to keep your legs raised when you are sitting. °· Do any exercises your health care providers have given you. These may include deep breathing exercises, coughing, walking, or other exercises. °SEEK MEDICAL CARE IF: °· You have any questions about your medicines. °· You have more leg pain, especially if your pain medicine stops working. °· New or growing bruises develop on your leg. °· Your leg swells, feels tight, or becomes red. °· You have numbness in your leg. °SEEK IMMEDIATE  MEDICAL CARE IF: °· Your pain gets much worse. °· Blood or fluid leaks from any of the incisions. °· Your incisions become warm, swollen, or red. °· You have chest pain. °· You have trouble breathing. °· You have a fever. °· You have more pain near your leg incision. °MAKE SURE YOU: °· Understand these instructions. °· Will watch your condition. °· Will get help right away if you are not doing well or get worse. °  °This information is not intended to replace advice given to you by your health care provider. Make sure you discuss any questions you have with your health care provider. °  °Document Released: 03/11/2011 Document Revised: 07/20/2014 Document Reviewed: 03/11/2011 °Elsevier Interactive Patient Education ©2016 [employer] °Coronary Artery Bypass Grafting, Care After °These instructions give you information on caring for yourself after your procedure. Your doctor may also give you more specific instructions. Call your doctor if you have any problems or questions after your procedure.  °HOME CARE °· Only take medicine as told by your doctor. Take medicines exactly as told. Do not stop taking medicines or start any new medicines without talking to your doctor first. °· Take your pulse as told by your doctor. °· Do deep breathing as told by your doctor. Use your breathing device (incentive spirometer), if given, to practice deep breathing several times a day. Support your chest with a pillow or your arms when you take deep breaths or cough. °· Keep the area clean, dry, and protected where the surgery cuts (incisions) were made. Remove bandages (dressings) only as told by your doctor. If strips were applied to surgical area, do not take   them off. They fall off on their own. °· Check the surgery area daily for puffiness (swelling), redness, or leaking fluid. °· If surgery cuts were made in your legs: °· Avoid crossing your legs. °· Avoid sitting for long periods of time. Change positions every 30  minutes. °· Raise your legs when you are sitting. Place them on pillows. °· Wear stockings that help keep blood clots from forming in your legs (compression stockings). °· Only take sponge baths until your doctor says it is okay to take showers. Pat the surgery area dry. Do not rub the surgery area with a washcloth or towel. Do not bathe, swim, or use a hot tub until your doctor says it is okay. °· Eat foods that are high in fiber. These include raw fruits and vegetables, whole grains, beans, and nuts. Choose lean meats. Avoid canned, processed, and fried foods. °· Drink enough fluids to keep your pee (urine) clear or pale yellow. °· Weigh yourself every day. °· Rest and limit activity as told by your doctor. You may be told to: °· Stop any activity if you have chest pain, shortness of breath, changes in heartbeat, or dizziness. Get help right away if this happens. °· Move around often for short amounts of time or take short walks as told by your doctor. Gradually become more active. You may need help to strengthen your muscles and build endurance. °· Avoid lifting, pushing, or pulling anything heavier than 10 pounds (4.5 kg) for at least 6 weeks after surgery. °· Do not drive until your doctor says it is okay. °· Ask your doctor when you can go back to work. °· Ask your doctor when you can begin sexual activity again. °· Follow up with your doctor as told. °GET HELP IF: °· You have puffiness, redness, more pain, or fluid draining from the incision site. °· You have a fever. °· You have puffiness in your ankles or legs. °· You have pain in your legs. °· You gain 2 or more pounds (0.9 kg) a day. °· You feel sick to your stomach (nauseous) or throw up (vomit). °· You have watery poop (diarrhea). °GET HELP RIGHT AWAY IF: °· You have chest pain that goes to your jaw or arms. °· You have shortness of breath. °· You have a fast or irregular heartbeat. °· You notice a "clicking" in your breastbone when you move. °· You  have numbness or weakness in your arms or legs. °· You feel dizzy or light-headed. °MAKE SURE YOU: °· Understand these instructions. °· Will watch your condition. °· Will get help right away if you are not doing well or get worse. °  °This information is not intended to replace advice given to you by your health care provider. Make sure you discuss any questions you have with your health care provider. °  °Document Released: 07/04/2013 Document Reviewed: 07/04/2013 °Elsevier Interactive Patient Education ©2016 Elsevier Inc. ° °

## 2016-02-29 LAB — GLUCOSE, CAPILLARY
Glucose-Capillary: 105 mg/dL — ABNORMAL HIGH (ref 65–99)
Glucose-Capillary: 84 mg/dL (ref 65–99)

## 2016-02-29 MED ORDER — CLOPIDOGREL BISULFATE 75 MG PO TABS: 75.0000 mg | ORAL_TABLET | Freq: Every day | ORAL | 1 refills | Status: DC

## 2016-02-29 MED ORDER — LISINOPRIL 5 MG PO TABS
5.0000 mg | ORAL_TABLET | Freq: Every day | ORAL | Status: DC
  Administered 2016-02-29: 5 mg via ORAL
  Filled 2016-02-29: qty 1

## 2016-02-29 MED ORDER — OXYCODONE HCL 5 MG PO TABS: 5.0000 mg | ORAL_TABLET | Freq: Four times a day (QID) | ORAL | 0 refills | Status: DC | PRN

## 2016-02-29 MED ORDER — LISINOPRIL 5 MG PO TABS: 5.0000 mg | ORAL_TABLET | Freq: Every day | ORAL | 1 refills | Status: DC

## 2016-02-29 NOTE — Progress Notes (Signed)
Pacing wires have and sutures have been removed per protocol, per MD order. Wires were intact after removal. Pt's vitals are stable. Sites are clean and dry. Gauze dressings were placed over pacing wire sites. Steri-strips were placed over suture sites. Pt is on bedrest until 12:15 pm. Pt is resting in bed with call light within reach. Vitals are being monitored every 15 minutes, per protocol. Will continue to monitor.   Berdine DanceLauren Moffitt BSN, RN

## 2016-02-29 NOTE — Progress Notes (Signed)
CARDIAC REHAB PHASE I   PRE:  Rate/Rhythm: 91 nsr with occ pvcs  BP:  Sitting: 123/78      SaO2: 98 ra  MODE:  Ambulation: 500 ft   POST:  Rate/Rhythm: 96 nsr with occ pvcs  BP:  Sitting: 154/85     SaO2: 98 ra 1015-1110 Patient ambulated in hallway independently. Steady gait noted. Patient states he has already ambulated x 3 this am. Post walk patient back to recliner with call bell in reach. All discharge education completed and [facility] book reviewed. Patient verbalized understanding. Patient stated he is the primary caregiver for his wife, however his son has taken off work for the week and will stay with patient and wife 24/7. Patient encouraged to take the roll of patient and not caregiver. Phase 2 cardiac rehab discussed. He is willing to have referral and order placed, however he is unsure if he can leave his wife long enough to participate. [facility] because it is closer for patient, but he declined all care from them. Patient is ready for discharge from cardiac rehab.  Harlowe Dowler English PayneRN, BSN 02/29/2016 11:14 AM

## 2016-02-29 NOTE — Progress Notes (Addendum)
[address]       [place] [id number]             [phone]      4 Days Post-Op Procedure(s) (LRB): CORONARY ARTERY BYPASS GRAFTING (CABG)X4  LIMA-LAD; SEQ SVG-OM1-OM2; SVG-PD ENDOSCOPIC GREATER SAPHENOUS VEIN HARVEST(EVH) RIGHT LEG (N/A) INTRAOPERATIVE TRANSESOPHAGEAL ECHOCARDIOGRAM (N/A) Subjective: Feels pretty well, mild productive cough  Objective: Vital signs in last 24 hours: Temp:  [97.9 F (36.6 C)-98.8 F (37.1 C)] 97.9 F (36.6 C) (08/19 0629) Pulse Rate:  [80-98] 80 (08/19 0629) Cardiac Rhythm: Sinus tachycardia;Bundle branch block (08/19 0727) Resp:  [18-19] 18 (08/18 2110) BP: (111-141)/(59-79) 134/79 (08/19 0629) SpO2:  [97 %-98 %] 98 % (08/19 0629) Weight:  [233 lb 4 oz (105.8 kg)] 233 lb 4 oz (105.8 kg) (08/19 0629)  Hemodynamic parameters for last 24 hours:    Intake/Output from previous day: 08/18 0701 - 08/19 0700 In: 240 [P.O.:240] Out: 1250 [Urine:1250] Intake/Output this shift: No intake/output data recorded.  General appearance: alert, cooperative and no distress Heart: regular rate and rhythm Lungs: clear to auscultation bilaterally Abdomen: benign Extremities: no edema Wound: incis healing well  Lab Results:  Recent Labs  02/27/16 0434 02/28/16 0341  WBC 14.5* 12.9*  HGB 11.2* 10.8*  HCT 34.0* 33.5*  PLT 156 156   BMET:  Recent Labs  02/27/16 0434 02/28/16 0341  NA 136 136  K 3.9 3.8  CL 104 105  CO2 24 23  GLUCOSE 121* 102*  BUN 9 13  CREATININE 0.96 0.96  CALCIUM 8.5* 8.7*    PT/INR: No results for input(s): LABPROT, INR in the last 72 hours. ABG    Component Value Date/Time   PHART 7.357 02/25/2016 1942   HCO3 22.5 02/25/2016 1942   TCO2 25 02/26/2016 1619   ACIDBASEDEF 3.0 (H) 02/25/2016 1942   O2SAT 97.0 02/25/2016 1942   CBG (last 3)   Recent Labs  02/28/16 1619 02/28/16 2045 02/29/16 0626  GLUCAP 97 94 105*    Meds Scheduled Meds: . acetaminophen  1,000 mg Oral Q6H   Or  . acetaminophen (TYLENOL) oral liquid 160 mg/5 mL  1,000 mg Per Tube Q6H  . aminocaproic acid (AMICAR) IVPB  5 g Intravenous Once  . aspirin EC  81 mg Oral Daily  . atorvastatin  80 mg Oral q1800  . bisacodyl  10 mg Oral Daily   Or  . bisacodyl  10 mg Rectal Daily  . clopidogrel  75 mg Oral Daily  . docusate sodium  200 mg Oral Daily  . furosemide  40 mg Oral Daily  . insulin aspart  0-24 Units Subcutaneous TID AC & HS  . insulin detemir  14 Units Subcutaneous BID  . latanoprost  1 drop Both Eyes QHS  . metoprolol tartrate  25 mg Oral BID   Or  . metoprolol tartrate  12.5 mg Per Tube BID  . pantoprazole  40 mg Oral Daily  . sodium chloride flush  3 mL Intravenous Q12H  . sodium chloride flush  3 mL Intravenous Q12H   Continuous Infusions: . sodium chloride Stopped (02/26/16 1000)  . sodium chloride    . sodium chloride 10 mL/hr at 02/26/16 0800  . insulin (NOVOLIN-R) infusion 3.1 Units/hr (02/26/16 1048)  . lactated ringers Stopped (02/25/16 2300)  . lactated ringers Stopped (02/27/16 0400)   PRN Meds:.sodium chloride, sodium chloride, guaiFENesin-dextromethorphan, metoprolol, morphine injection, ondansetron (ZOFRAN) IV, oxyCODONE, sodium chloride flush, sodium chloride flush, traMADol  Xrays Dg Chest 2 View  Result Date: 02/28/2016 CLINICAL DATA:  CABG. EXAM: CHEST  2 VIEW COMPARISON:  02/27/2016.  02/21/2016.  01/11/2016. FINDINGS: Prior CABG. Cardiomegaly with normal pulmonary vascularity. Low lung volumes with mild bibasilar atelectasis. Small bilateral pleural effusions. IMPRESSION: 1. Questionable mild infiltrate left mid lung. Follow-up chest x-rays recommended demonstrate complete clearing. 2. Prior CABG. Cardiomegaly. No pulmonary venous congestion . Low lung volumes with bibasilar subsegment atelectasis. Small bilateral pleural effusions. Electronically Signed   By: Maisie Fushomas  Register   On: 02/28/2016 07:36    Assessment/Plan: S/P Procedure(s) (LRB): CORONARY ARTERY  BYPASS GRAFTING (CABG)X4  LIMA-LAD; SEQ SVG-OM1-OM2; SVG-PD ENDOSCOPIC GREATER SAPHENOUS VEIN HARVEST(EVH) RIGHT LEG (N/A) INTRAOPERATIVE TRANSESOPHAGEAL ECHOCARDIOGRAM (N/A) 1 doing well 2 d/c epw's  3 BP should tol. Low dose ACE-I 4 + PVC's cont beta blocker 5 d/c today  LOS: 4 days    GOLD,WAYNE E 02/29/2016  Patient seen and examined, agree with above Dc home  Nikyah Lackman C. Dorris FetchHendrickson, MD [facility] [phone]

## 2016-02-29 NOTE — Progress Notes (Signed)
Pt has been discharged home with family. IVs were removed with no complications. Telemetry box was removed. Pt received discharge instructions and all questions were answered. Pt received prescriptions for plavix and oxycodone. Pt was instructed to get these meds filled at pharmacy and to pick up the lisinopril that was sent to his pharmacy as well. Pt verbalized understanding. Pt left the unit via wheelchair and was accompanied by a nurse tech and pt's son. Pt was in no distress at time of discharge.  Berdine DanceLauren Moffitt BSN, RN

## 2016-03-13 ENCOUNTER — Encounter: Payer: Medicare Other | Admitting: Physician Assistant

## 2016-03-17 ENCOUNTER — Encounter: Payer: Self-pay | Admitting: Cardiothoracic Surgery

## 2016-03-24 ENCOUNTER — Other Ambulatory Visit: Payer: Self-pay | Admitting: Cardiothoracic Surgery

## 2016-03-24 DIAGNOSIS — Z951 Presence of aortocoronary bypass graft: Secondary | ICD-10-CM

## 2016-03-25 ENCOUNTER — Ambulatory Visit (INDEPENDENT_AMBULATORY_CARE_PROVIDER_SITE_OTHER): Payer: Self-pay | Admitting: Cardiothoracic Surgery

## 2016-03-25 ENCOUNTER — Encounter: Payer: Self-pay | Admitting: Cardiothoracic Surgery

## 2016-03-25 ENCOUNTER — Ambulatory Visit
Admission: RE | Admit: 2016-03-25 | Discharge: 2016-03-25 | Disposition: A | Discharge status: Home / Self Care | Payer: Medicare Other | Source: Ambulatory Visit | Attending: Cardiothoracic Surgery | Admitting: Cardiothoracic Surgery

## 2016-03-25 VITALS — BP 129/72 | HR 73 | Resp 16 | Ht 74.0 in | Wt 228.0 lb

## 2016-03-25 DIAGNOSIS — I25110 Atherosclerotic heart disease of native coronary artery with unstable angina pectoris: Secondary | ICD-10-CM

## 2016-03-25 DIAGNOSIS — Z951 Presence of aortocoronary bypass graft: Secondary | ICD-10-CM

## 2016-03-25 DIAGNOSIS — I213 ST elevation (STEMI) myocardial infarction of unspecified site: Secondary | ICD-10-CM

## 2016-03-25 NOTE — Progress Notes (Signed)
PCP is No PCP Per Patient Referring Provider is Bobby Price*  Chief Complaint  Patient presents with  . Routine Post Op    f/u s/p CABG X 4..02/25/16...with a CXR    HPI: 1 month follow-up after CABG 10 in this 70 year old non diabetic male reformed smoker. Prior to surgery he presented with acute D MI which was treated with bare-metal stent and Brilinta. After recover from his BMI he underwent multivessel CABG including a vein graft to the posterior descending which was a small diffusely diseased vessel. The patient was discharged home on Plavix which she should continue for total 3 months.  The patient is done well since returning home. He denies recurrent symptoms of angina. Surgical incisions are well-healed.  Chest x-ray taken today is personally reviewed and shows a mild-moderate left pleural effusion. There are some small areas of atelectasis-scarring possibly related to his left chest tube placement.  Past Medical History:  Diagnosis Date  . Asthma    as a child  . Coronary artery disease   . GERD (gastroesophageal reflux disease)   . Hyperlipidemia   . Hypertension   . Myocardial infarction (HCC)   . Nocturia   . Wears glasses     Past Surgical History:  Procedure Laterality Date  . CARDIAC CATHETERIZATION N/A 01/11/2016   Procedure: Left Heart Cath and Coronary Angiography;  Surgeon: Bobby Hazel, MD;  Location[facility] INVASIVE CV LAB;  Service: Cardiovascular;  Laterality: N/A;  . COLONOSCOPY    . CORONARY ANGIOPLASTY    . CORONARY ARTERY BYPASS GRAFT N/A 02/25/2016   Procedure: CORONARY ARTERY BYPASS GRAFTING (CABG)X4  LIMA-LAD; SEQ SVG-OM1-OM2; SVG-PD ENDOSCOPIC GREATER SAPHENOUS VEIN HARVEST(EVH) RIGHT LEG;  Surgeon: Bobby Perna, MD;  Location: [facility] OR;  Service: Open Heart Surgery;  Laterality: N/A;  . INTRAOPERATIVE TRANSESOPHAGEAL ECHOCARDIOGRAM N/A 02/25/2016   Procedure: INTRAOPERATIVE TRANSESOPHAGEAL ECHOCARDIOGRAM;  Surgeon: Bobby Perna, MD;  Location: [facility] OR;  Service: Open Heart Surgery;  Laterality: N/A;    Family History  Problem Relation Age of Onset  . Heart attack Maternal Grandfather     Social History Social History  Substance Use Topics  . Smoking status: Former Smoker    Packs/day: 4.00    Years: 10.00    Types: Cigarettes  . Smokeless tobacco: Never Used     Comment: quit in 1976  . Alcohol use 0.0 oz/week     Comment: He drinks up to 6 beers per day    Current Outpatient Prescriptions  Medication Sig Dispense Refill  . aspirin 81 MG chewable tablet Chew 1 tablet (81 mg total) by mouth daily.    Marland Kitchen atorvastatin (LIPITOR) 80 MG tablet Take 1 tablet (80 mg total) by mouth daily at 6 PM. 30 tablet 6  . clopidogrel (PLAVIX) 75 MG tablet Take 1 tablet (75 mg total) by mouth daily. 30 tablet 1  . lisinopril (PRINIVIL,ZESTRIL) 5 MG tablet Take 1 tablet (5 mg total) by mouth daily. 30 tablet 1  . metoprolol tartrate (LOPRESSOR) 25 MG tablet Take 1 tablet (25 mg total) by mouth 2 (two) times daily. 60 tablet 6  . oxyCODONE (OXY IR/ROXICODONE) 5 MG immediate release tablet Take 1-2 tablets (5-10 mg total) by mouth every 6 (six) hours as needed for severe pain. 30 tablet 0   No current facility-administered medications for this visit.     Allergies  Allergen Reactions  . No Known Allergies Other (See Comments)    Review of Systems  Improved appetite  and strength Walking daily No significant problems  BP 129/72   Pulse 73   Resp 16   Ht 6\' 2"  (1.88 m)   Wt 228 lb (103.4 kg)   SpO2 98% Comment: ON RA  BMI 29.27 kg/m  Physical Exam      Exam    General- alert and comfortable   Lungs- clear without rales, wheezes   Cor- regular rate and rhythm, no murmur , gallop   Abdomen- soft, non-tender   Extremities - warm, non-tender, minimal edema   Neuro- oriented, appropriate, no focal weakness   Diagnostic Tests: Chest x-ray with mild left pleural effusion Left upper lung field with some  nonspecific markings probable atelectasis from chest tube position Impression: Doing very well one month postop Patient is released to resume driving and normal daily activities with maximum weight limit 20 pounds  He was given a seven-day prescription of Lasix for the left pleural effusion  He'll return for follow-up with chest x-ray in one month.  Plan: Continue current medications and return in one month for chest x-ray to follow up left pleural effusion   Bobby BussingPeter Van Trigt III, MD [facility] [phone]

## 2016-04-14 NOTE — Progress Notes (Signed)
Cardiology Office Note    Date:  04/15/2016   ID:  Bobby Price, DOB 25-Oct-1945, MRN [id number]  PCP:  No PCP Per Patient  Cardiologist:  Dr. Sanjuana Kava  CC: post hosp follow up s/p CABG  History of Present Illness:  Bobby Price is a 70 y.o. male with a history of HTN, prior smoker, pre-diabetes and recently diagnosed CAD s/p BMS to RCA and subsequent CABG x4V (02/25/16) who presents to clinic for follow up.   He initially presented in early July 2017 with acute chest pain, positive cardiac enzymes and inferior STE on ECG and underwent urgent catheterization. This demonstrated a totally occluded RCA with significant left main and multivessel CAD as well. The patient underwent a bare-metal stent to reperfuse the RCA with good result. The plan was to allow the patient to recover from the RCA occlusion-MI and proceed with multivessel CABG after Brilinta could be safely stopped one month after PCI.While the patient was hospitalized he had echocardiogram which showed normal LV function no significant valvular abnormalities  The patient underwent CABG x4V (LIMA--> LAD, SVG--> post desc, SVG--> OM1//OM2) on 02/25/2016. He had some postoperative volume overload but has responded well to diuretics but otherwise post op course was uncomplicated and show as discharged on 02/29/16.  He saw Dr. Morton Peters on 03/25/16 for follow up. CXR showed a mild-mod left pleural effusion. He was given a seven-day prescription of Lasix for this.   Today he presents to clinic for follow up. He feels pretty good except has significant dizziness when he stands up. No chest pain or SOB. No LE edema, orthopnea or PND. No passing out. He was walking 1/2 hour twice a day but cut back to once a day since it was wearing him out. He doesn't want to do cardiac rehab because his wife has COPD and he needs to be at home with her. No pain in legs with walking.    Past Medical History:  Diagnosis Date  . Asthma    as a  child  . Coronary artery disease   . GERD (gastroesophageal reflux disease)   . Hyperlipidemia   . Hypertension   . Myocardial infarction   . Nocturia   . Wears glasses     Past Surgical History:  Procedure Laterality Date  . CARDIAC CATHETERIZATION N/A 01/11/2016   Procedure: Left Heart Cath and Coronary Angiography;  Surgeon: [name], MD;  Location[facility] INVASIVE CV LAB;  Service: Cardiovascular;  Laterality: N/A;  . COLONOSCOPY    . CORONARY ANGIOPLASTY    . CORONARY ARTERY BYPASS GRAFT N/A 02/25/2016   Procedure: CORONARY ARTERY BYPASS GRAFTING (CABG)X4  LIMA-LAD; SEQ SVG-OM1-OM2; SVG-PD ENDOSCOPIC GREATER SAPHENOUS VEIN HARVEST(EVH) RIGHT LEG;  Surgeon: Bobby Perna, MD;  Location: [facility] OR;  Service: Open Heart Surgery;  Laterality: N/A;  . INTRAOPERATIVE TRANSESOPHAGEAL ECHOCARDIOGRAM N/A 02/25/2016   Procedure: INTRAOPERATIVE TRANSESOPHAGEAL ECHOCARDIOGRAM;  Surgeon: Bobby Perna, MD;  Location: [facility] OR;  Service: Open Heart Surgery;  Laterality: N/A;    Current Medications: Outpatient Medications Prior to Visit  Medication Sig Dispense Refill  . aspirin 81 MG chewable tablet Chew 1 tablet (81 mg total) by mouth daily.    Marland Kitchen atorvastatin (LIPITOR) 80 MG tablet Take 1 tablet (80 mg total) by mouth daily at 6 PM. 30 tablet 6  . clopidogrel (PLAVIX) 75 MG tablet Take 1 tablet (75 mg total) by mouth daily. 30 tablet 1  . lisinopril (PRINIVIL,ZESTRIL) 5 MG tablet Take 1  tablet (5 mg total) by mouth daily. 30 tablet 1  . metoprolol tartrate (LOPRESSOR) 25 MG tablet Take 1 tablet (25 mg total) by mouth 2 (two) times daily. 60 tablet 6  . oxyCODONE (OXY IR/ROXICODONE) 5 MG immediate release tablet Take 1-2 tablets (5-10 mg total) by mouth every 6 (six) hours as needed for severe pain. 30 tablet 0   No facility-administered medications prior to visit.      Allergies:   Review of patient's allergies indicates no known allergies.   Social History   Social History  .  Marital status: Married    Spouse name: N/A  . Number of children: N/A  . Years of education: N/A   Social History Main Topics  . Smoking status: Former Smoker    Packs/day: 4.00    Years: 10.00    Types: Cigarettes  . Smokeless tobacco: Never Used     Comment: quit in 1976  . Alcohol use 0.0 oz/week     Comment: He drinks up to 6 beers per day  . Drug use: No  . Sexual activity: Not Asked   Other Topics Concern  . None   Social History Narrative  . None     Family History:  The patient's family history includes Heart attack in his maternal grandfather.     ROS:   Please see the history of present illness.    ROS All other systems reviewed and are negative.   PHYSICAL EXAM:   VS:  BP 104/76   Pulse 66   Ht 6\' 2"  (1.88 m)   Wt 224 lb 1.9 oz (101.7 kg)   SpO2 98%   BMI 28.78 kg/m    GEN: Well nourished, well developed, in no acute distress  HEENT: normal  Neck: no JVD, carotid bruits, or masses Cardiac: RRR; no murmurs, rubs, or gallops,no edema  Respiratory:  clear to auscultation bilaterally, normal work of breathing GI: soft, nontender, nondistended, + BS MS: no deformity or atrophy  Skin: warm and dry, no rash Neuro:  Alert and Oriented x 3, Strength and sensation are intact Psych: euthymic mood, full affect  Wt Readings from Last 3 Encounters:  04/15/16 224 lb 1.9 oz (101.7 kg)  03/25/16 228 lb (103.4 kg)  02/29/16 233 lb 4 oz (105.8 kg)      Studies/Labs Reviewed:   EKG:  EKG is ordered today.  The ekg ordered today demonstrates NSR with RBBB and inferior Q waves HR 66  Recent Labs: 02/21/2016: ALT 33 02/26/2016: Magnesium 2.3 02/28/2016: BUN 13; Creatinine, Ser 0.96; Hemoglobin 10.8; Platelets 156; Potassium 3.8; Sodium 136   Lipid Panel    Component Value Date/Time   CHOL 228 (H) 01/11/2016 1807   TRIG 116 01/11/2016 1807   HDL 48 01/11/2016 1807   CHOLHDL 4.8 01/11/2016 1807   VLDL 23 01/11/2016 1807   LDLCALC 157 (H) 01/11/2016 1807     Additional studies/ records that were reviewed today include:  Left Heart Cath and Coronary Angiography   01/11/16    Conclusion     Mid RCA lesion, 50% stenosed.  Ost Cx to Prox Cx lesion, 50% stenosed.  LM-1 lesion, 50% stenosed.  LM-2 lesion, 80% stenosed.  Prox Cx lesion, 70% stenosed.  Ost 2nd Mrg to 2nd Mrg lesion, 70% stenosed.  Prox LAD to Mid LAD lesion, 20% stenosed.  Dist LAD-1 lesion, 50% stenosed.  Dist LAD-2 lesion, 20% stenosed.  Dist RCA lesion, 100% stenosed. Post intervention, there is a 0% residual stenosis.  The left ventricular systolic function is normal.  1. Acute MI/NSTEMI secondary to occluded distal RCA 2. Successful PTCA/bare metal stent placement x 1 distal RCA  3. There is complex disease involving the distal left main (50%), ostial LAD (80%) and ostial Circumflex (50%). The mid RCA has moderate stenosis. The proximal Circumflex and OM1 has moderately severe stenosis.  4. Overall preserved LV systolic function  Recommendations: His acute event was the occluded distal RCA. I treated this with a bare metal stent given the disease in the left main, LAD and Circumflex. I think that these vessels will be best treated with CABG as it would be hard to avoid plaque shift into the Circumflex if the LAD were treated with a stent. There is moderate stenosis in the distal left main as well. Will treat with ASA, Brilinta, statin and beta blocker. Echo in am. Will review films with the interventional team Monday and if there is agreement, will get CT surgery involved Monday to begin planning for CABG (bypass of the LAD,Circumflex) in one month (One month of ASA and Brilinta post MI and bare metal stenting).     Echo 01/12/16 LV EF: 60% - 65% Study Conclusions - Procedure narrative: Transthoracic echocardiography. Technically  difficult study with reduced echocardiographic windows and poor  endocardial definition. Intravenous contrast (Definity)  was  administered. - Left ventricle: The cavity size was normal. There was moderate  concentric hypertrophy. Systolic function was normal. The  estimated ejection fraction was in the range of 60% to 65%.  Definity contrast images demonstrate normal wall motion. No LV  thrombus was noted. The study is not technically sufficient to  allow evaluation of LV diastolic function. - Mitral valve: Mildly thickened leaflets . There was trivial  regurgitation.  - Left atrium: The atrium was normal in size. - Inferior vena cava: The vessel was dilated. The respirophasic  diameter changes were blunted (< 50%), consistent with elevated  central venous pressure. Impressions - Technically difficult study. Contrast was given. Wall motion  appears normal. LVEF 60-65%.    ASSESSMENT & PLAN:   Bobby Price is a 70 y.o. male with a history of HTN, prior smoker, pre-diabetes and recently diagnosed CAD s/p BMS to RCA and subsequent CABG x4V (02/25/16) who presents to clinic for follow up.   CAD s/p CABG: he initially presented with inferior STEMI. He underwent BMS to RCA in 01/2016 followed by CABG x4V after 1 month of DAPT. Continue ASA and plavix as well as BB, ACE and statin  HTN: BP a little soft and having some orthostatic hypotension. Will decrease lopressor back from 25mg  BID --> 12.5mg  BID.  HLD: LDL 157 on 01/11/16. Started on atorvastatin 80mg  daily. Goal <70.  Pre-diabetes: counseled on diet and exercise  Medication Adjustments/Labs and Tests Ordered: Current medicines are reviewed at length with the patient today.  Concerns regarding medicines are outlined above.  Medication changes, Labs and Tests ordered today are listed in the Patient Instructions below. Patient Instructions  Medication Instructions:  Your physician recommends that you continue on your current medications as directed. Please refer to the Current Medication list given to you today.   Labwork: None  ordererd  Testing/Procedures: None ordered  Follow-Up: Your physician recommends that you schedule a follow-up appointment in:    Any Other Special Instructions Will Be Listed Below (If Applicable).     If you need a refill on your cardiac medications before your next appointment, please call your pharmacy.  Signed, Cline Crock, PA-C  04/15/2016 9:23 AM    [facility] [address] [place], [place], [place]  [address] Phone: [phone]; Fax: [phone]

## 2016-04-15 ENCOUNTER — Encounter: Payer: Self-pay | Admitting: Physician Assistant

## 2016-04-15 ENCOUNTER — Ambulatory Visit (INDEPENDENT_AMBULATORY_CARE_PROVIDER_SITE_OTHER): Payer: Self-pay | Admitting: Physician Assistant

## 2016-04-15 VITALS — BP 104/76 | HR 66 | Ht 74.0 in | Wt 224.1 lb

## 2016-04-15 DIAGNOSIS — Z951 Presence of aortocoronary bypass graft: Secondary | ICD-10-CM

## 2016-04-15 DIAGNOSIS — Z955 Presence of coronary angioplasty implant and graft: Secondary | ICD-10-CM

## 2016-04-15 DIAGNOSIS — I213 ST elevation (STEMI) myocardial infarction of unspecified site: Secondary | ICD-10-CM

## 2016-04-15 DIAGNOSIS — E785 Hyperlipidemia, unspecified: Secondary | ICD-10-CM

## 2016-04-15 DIAGNOSIS — I214 Non-ST elevation (NSTEMI) myocardial infarction: Secondary | ICD-10-CM

## 2016-04-15 DIAGNOSIS — I10 Essential (primary) hypertension: Secondary | ICD-10-CM

## 2016-04-15 MED ORDER — METOPROLOL TARTRATE 25 MG PO TABS: 12.5000 mg | ORAL_TABLET | Freq: Two times a day (BID) | ORAL | 3 refills | Status: DC

## 2016-04-15 MED ORDER — CLOPIDOGREL BISULFATE 75 MG PO TABS: 75.0000 mg | ORAL_TABLET | Freq: Every day | ORAL | 3 refills | Status: DC

## 2016-04-15 MED ORDER — ATORVASTATIN CALCIUM 80 MG PO TABS: 80.0000 mg | ORAL_TABLET | Freq: Every day | ORAL | 3 refills | Status: DC

## 2016-04-15 MED ORDER — LISINOPRIL 5 MG PO TABS: 5.0000 mg | ORAL_TABLET | Freq: Every day | ORAL | 3 refills | Status: DC

## 2016-04-15 NOTE — Patient Instructions (Addendum)
Medication Instructions:  Your physician has recommended you make the following change in your medication:  1.  DECREASE the Metoprolol to 25 mg taking 1/2 tablet twice a day We have sent in refills for all your medications  Labwork: None ordererd  Testing/Procedures: None ordered  Follow-Up: Your physician wants you to follow-up in: 6 MONTHS WITH DR. Clifton JamesMCALHANY  You will receive a reminder letter in the mail two months in advance. If you don't receive a letter, please call our office to schedule the follow-up appointment.    Any Other Special Instructions Will Be Listed Below (If Applicable).     If you need a refill on your cardiac medications before your next appointment, please call your pharmacy.

## 2016-04-29 ENCOUNTER — Ambulatory Visit: Payer: Medicare Other | Admitting: Cardiothoracic Surgery

## 2016-05-05 ENCOUNTER — Other Ambulatory Visit: Payer: Self-pay | Admitting: Cardiothoracic Surgery

## 2016-05-05 DIAGNOSIS — Z951 Presence of aortocoronary bypass graft: Secondary | ICD-10-CM

## 2016-05-06 ENCOUNTER — Encounter: Payer: Self-pay | Admitting: Cardiothoracic Surgery

## 2016-05-06 ENCOUNTER — Ambulatory Visit (INDEPENDENT_AMBULATORY_CARE_PROVIDER_SITE_OTHER): Payer: Self-pay | Admitting: Cardiothoracic Surgery

## 2016-05-06 ENCOUNTER — Ambulatory Visit
Admission: RE | Admit: 2016-05-06 | Discharge: 2016-05-06 | Disposition: A | Discharge status: Home / Self Care | Payer: Medicare Other | Source: Ambulatory Visit | Attending: Cardiothoracic Surgery | Admitting: Cardiothoracic Surgery

## 2016-05-06 VITALS — BP 114/75 | HR 71 | Resp 16 | Ht 74.0 in | Wt 224.0 lb

## 2016-05-06 DIAGNOSIS — Z951 Presence of aortocoronary bypass graft: Secondary | ICD-10-CM

## 2016-05-06 DIAGNOSIS — I25110 Atherosclerotic heart disease of native coronary artery with unstable angina pectoris: Secondary | ICD-10-CM

## 2016-05-06 NOTE — Progress Notes (Signed)
PCP is No PCP Per Patient Referring Provider is Kathleene HazelMcAlhany, Christopher D*  Chief Complaint  Patient presents with  . Routine Post Op    1 month f/u with a CXR    HPI: Patient continues to do well 2 months after multivessel CABG He returns today for evaluation with a chest x-ray to assess a postop mild-moderate left pleural effusion He continues to feel well increasing his activities and denies symptoms of angina or heart failure. Chest x-ray today shows significant improvement of the left effusion now trace. He has no evidence of edema on exam.  Past Medical History:  Diagnosis Date  . Asthma    as a child  . Coronary artery disease   . GERD (gastroesophageal reflux disease)   . Hyperlipidemia   . Hypertension   . Myocardial infarction   . Nocturia   . Wears glasses     Past Surgical History:  Procedure Laterality Date  . CARDIAC CATHETERIZATION N/A 01/11/2016   Procedure: Left Heart Cath and Coronary Angiography;  Surgeon: Kathleene Hazelhristopher D McAlhany, MD;  [facility] INVAS[facility] LAB;  Service: Cardiovascular;  Laterality: N/A;  . COLONOSCOPY    . CORONARY ANGIOPLASTY    . CORONARY ARTERY BYPASS GRAFT N/A 02/25/2016   Procedure: CORONARY ARTERY BYPASS GRAFTING (CABG)X4  LIMA-LAD; SEQ SVG-OM1-OM2; SVG-PD ENDOSCOPIC GREATER SAPHENOUS VEIN HARVEST(EVH) RIGHT LEG;  Surgeon: Kerin PernaPeter Van Trigt, MD;  Location: [facility] OR;  Service: Open Heart Surgery;  Laterality: N/A;  . INTRAOPERATIVE TRANSESOPHAGEAL ECHOCARDIOGRAM N/A 02/25/2016   Procedure: INTRAOPERATIVE TRANSESOPHAGEAL ECHOCARDIOGRAM;  Surgeon: Kerin PernaPeter Van Trigt, MD;  [facility];  Service: Open Heart Surgery;  Laterality: N/A;    Family History  Problem Relation Age of Onset  . Heart attack Maternal Grandfather     Social History Social History  Substance Use Topics  . Smoking status: Former Smoker    Packs/day: 4.00    Years: 10.00    Types: Cigarettes  . Smokeless tobacco: Never Used     Comment: quit in 1976  . Alcohol use  0.0 oz/week     Comment: He drinks up to 6 beers per day    Current Outpatient Prescriptions  Medication Sig Dispense Refill  . atorvastatin (LIPITOR) 80 MG tablet Take 1 tablet (80 mg total) by mouth daily at 6 PM. 90 tablet 3  . clopidogrel (PLAVIX) 75 MG tablet Take 1 tablet (75 mg total) by mouth daily. 90 tablet 3  . lisinopril (PRINIVIL,ZESTRIL) 5 MG tablet Take 1 tablet (5 mg total) by mouth daily. 90 tablet 3  . metoprolol tartrate (LOPRESSOR) 25 MG tablet Take 0.5 tablets (12.5 mg total) by mouth 2 (two) times daily. 180 tablet 3   No current facility-administered medications for this visit.     No Known Allergies  Review of Systems  Improved overall strength Improved appetite Doing normal daily activities under the 20 pound weight restriction for 3 months after surgery  BP 114/75   Pulse 71   Resp 16   Ht 6\' 2"  (1.88 m)   Wt 224 lb (101.6 kg)   SpO2 99% Comment: ON RA  BMI 28.76 kg/m  Physical Exam      Exam    General- alert and comfortable   Lungs- clear without rales, wheezes   Cor- regular rate and rhythm, no murmur , gallop   Abdomen- soft, non-tender   Extremities - warm, non-tender, minimal edema   Neuro- oriented, appropriate, no focal weakness   Diagnostic Tests: Chest x-ray clear left  effusion  Impression: Doing well 2 months status post multivessel CABG  Plan: Continue lifting restriction of 20 pounds until 3 months after surgery Patient was told he could stop the postop Plavix after January 1 but continue aspirin indefinitely [name], MD [facility] [phone]

## 2016-06-29 LAB — ECHO TEE
LVOT MV VTI: 2.83
LVOT SV: 72 mL
LVOT VTI: 22.8 cm
LVOT area: 3.14 cm2
LVOT diameter: 20 mm
LVOT peak vel: 88.3 cm/s
MV Annulus VTI: 25.3 cm
MV M vel: 23.5
Mean grad: 0 mmHg

## 2017-04-24 ENCOUNTER — Other Ambulatory Visit: Payer: Self-pay | Admitting: Physician Assistant

## 2017-04-26 ENCOUNTER — Other Ambulatory Visit: Payer: Self-pay | Admitting: Physician Assistant

## 2017-05-23 ENCOUNTER — Other Ambulatory Visit: Payer: Self-pay | Admitting: Physician Assistant

## 2017-05-24 ENCOUNTER — Other Ambulatory Visit: Payer: Self-pay | Admitting: Physician Assistant

## 2017-06-16 ENCOUNTER — Other Ambulatory Visit: Payer: Self-pay | Admitting: Physician Assistant

## 2017-06-17 ENCOUNTER — Other Ambulatory Visit: Payer: Self-pay | Admitting: Physician Assistant

## 2017-08-04 IMAGING — CR DG CHEST 1V PORT
1 series · 1 of 1 positions shown · non-contrast
Comparison: 02/21/2016

CLINICAL DATA: Coronary bypass grafting

EXAM:
PORTABLE CHEST 1 VIEW

[AP]
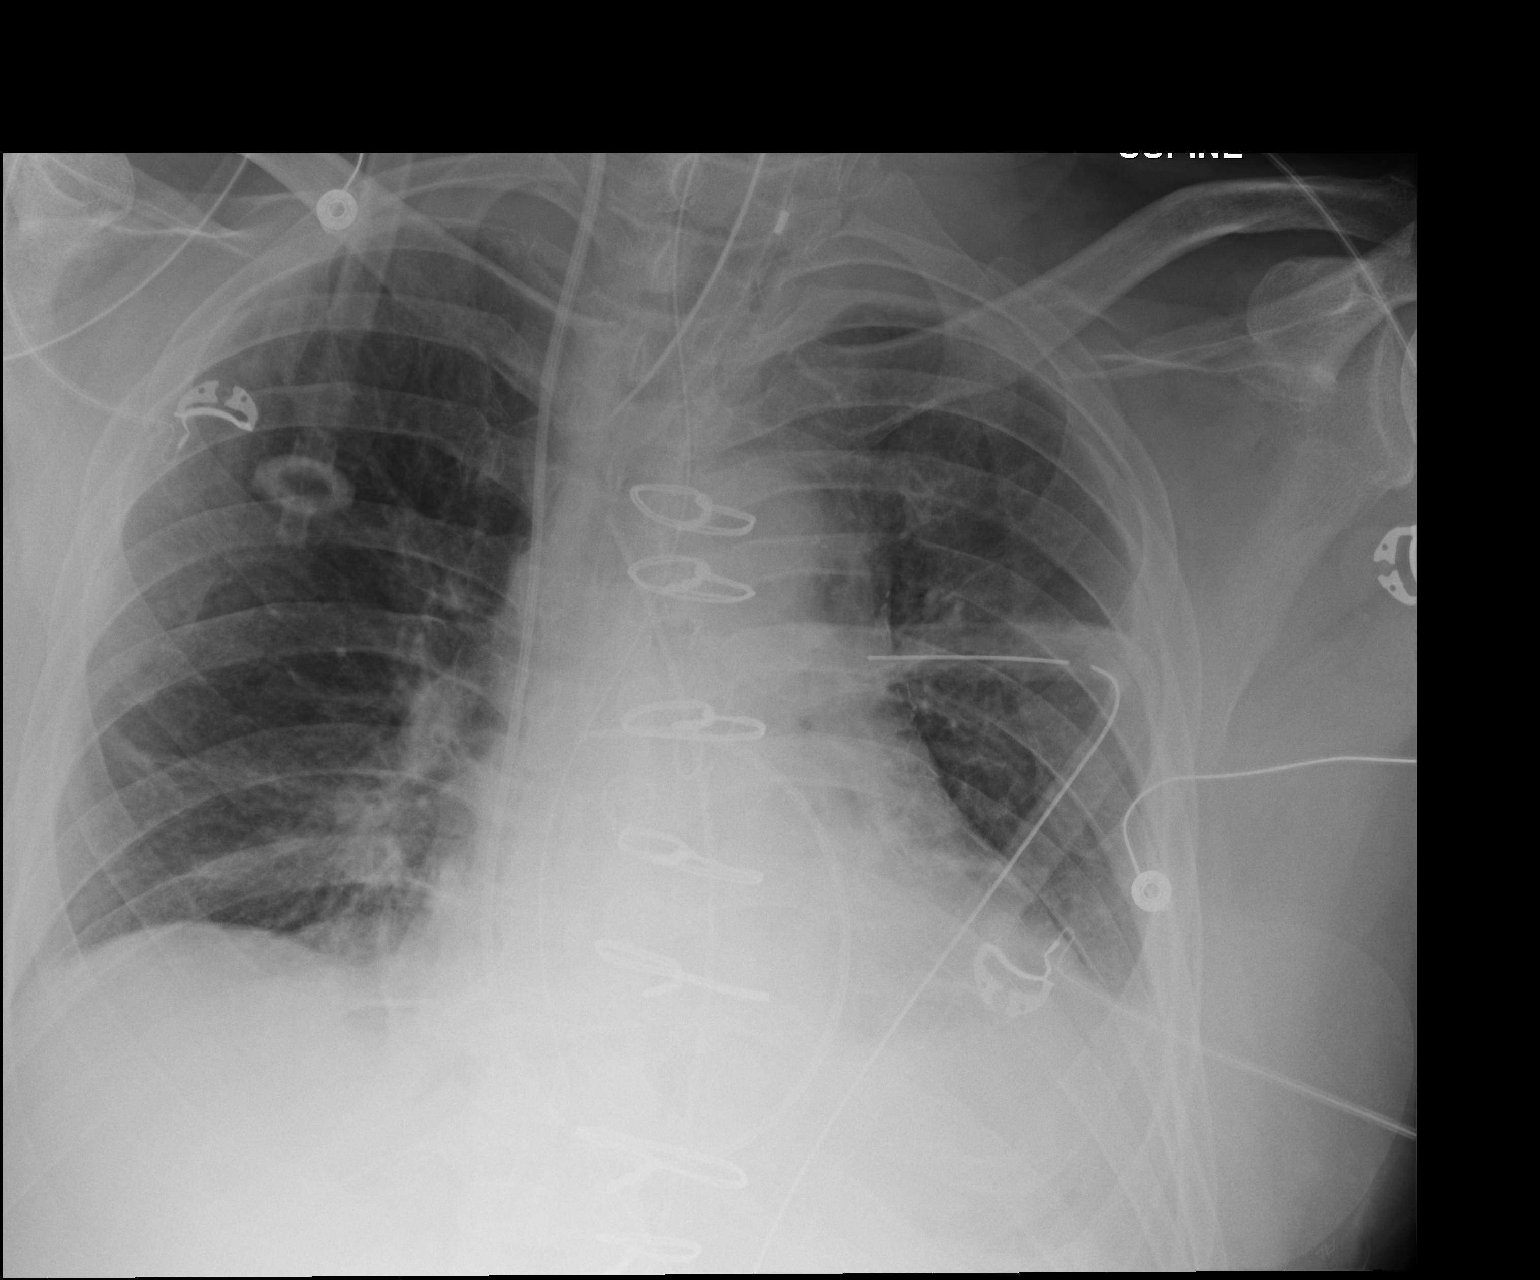

[1 of 1 positions shown; findings below may reference images not displayed]

FINDINGS: Cardiac shadow is within normal limits. Postsurgical changes are
noted. An endotracheal tube, nasogastric catheter, mediastinal drain
and left thoracostomy catheter are noted. No pneumothorax is seen.
Mild left basilar atelectasis is noted. Swan-Ganz catheter is noted
in the pulmonary outflow tract on the right.
IMPRESSION: Tubes and lines as described.

Left basilar atelectasis.

## 2017-08-05 IMAGING — CR DG CHEST 1V PORT
1 series · 1 of 1 positions shown · non-contrast
Comparison: 02/25/2016.

CLINICAL DATA: Sore chest.

EXAM:
PORTABLE CHEST 1 VIEW

[AP]
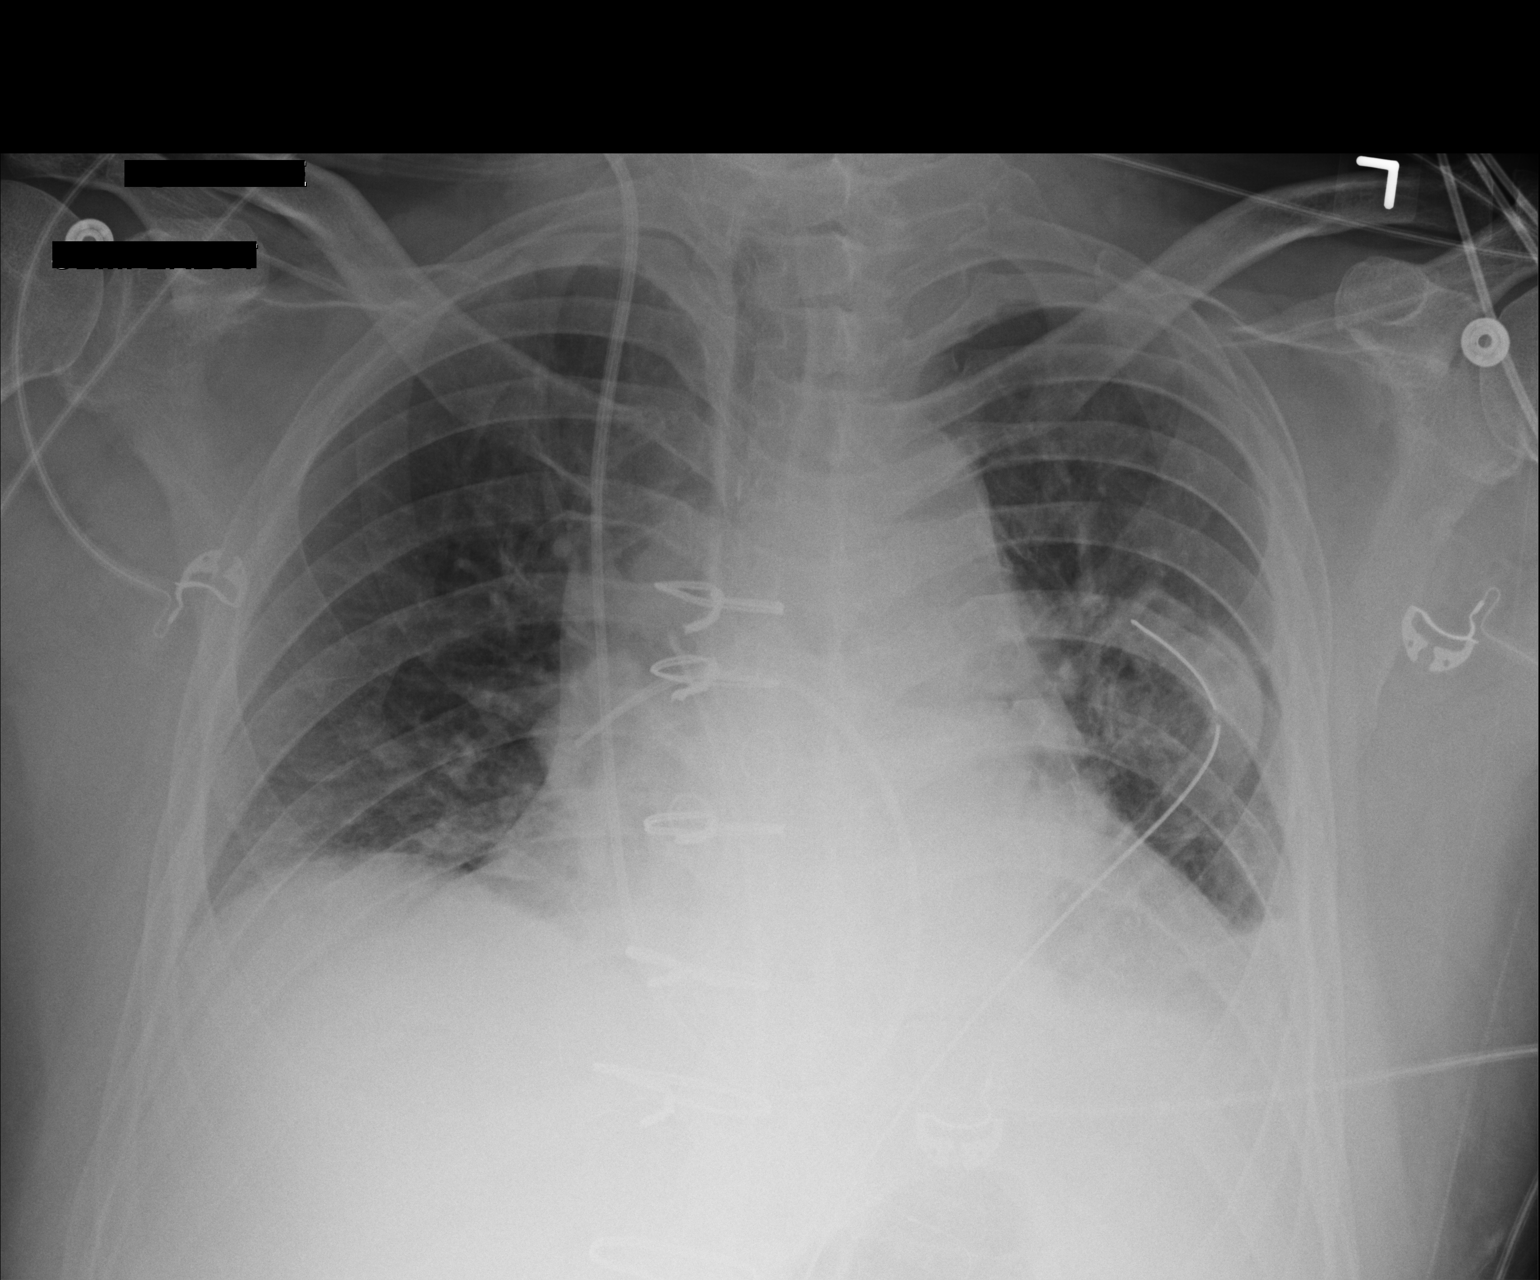

[1 of 1 positions shown; findings below may reference images not displayed]

FINDINGS: Interim extubation removal of NG tube. Swan-Ganz catheter,
mediastinal drainage catheter, left chest tube in stable position.
No pneumothorax. Prior CABG. Stable cardiomegaly. Low lung volumes
with mild bibasilar atelectasis and/or infiltrates again noted.
Small left pleural effusion cannot be excluded.
IMPRESSION: 1. Interim extubation removal of NG tube. Swan-Ganz catheter,
mediastinal drainage catheter, left chest tube stable position. No
pneumothorax.

2. Low lung volumes with mild bibasilar atelectasis and/or
infiltrates. Small left pleural effusion cannot be excluded. Similar
findings noted on prior exam.

3.  Prior CABG.  Cardiomegaly

## 2017-08-06 IMAGING — CR DG CHEST 1V PORT
1 series · 1 of 1 positions shown · non-contrast
Comparison: 02/26/2016.

CLINICAL DATA: Sore chest.  CABG.

EXAM:
PORTABLE CHEST 1 VIEW

[AP]
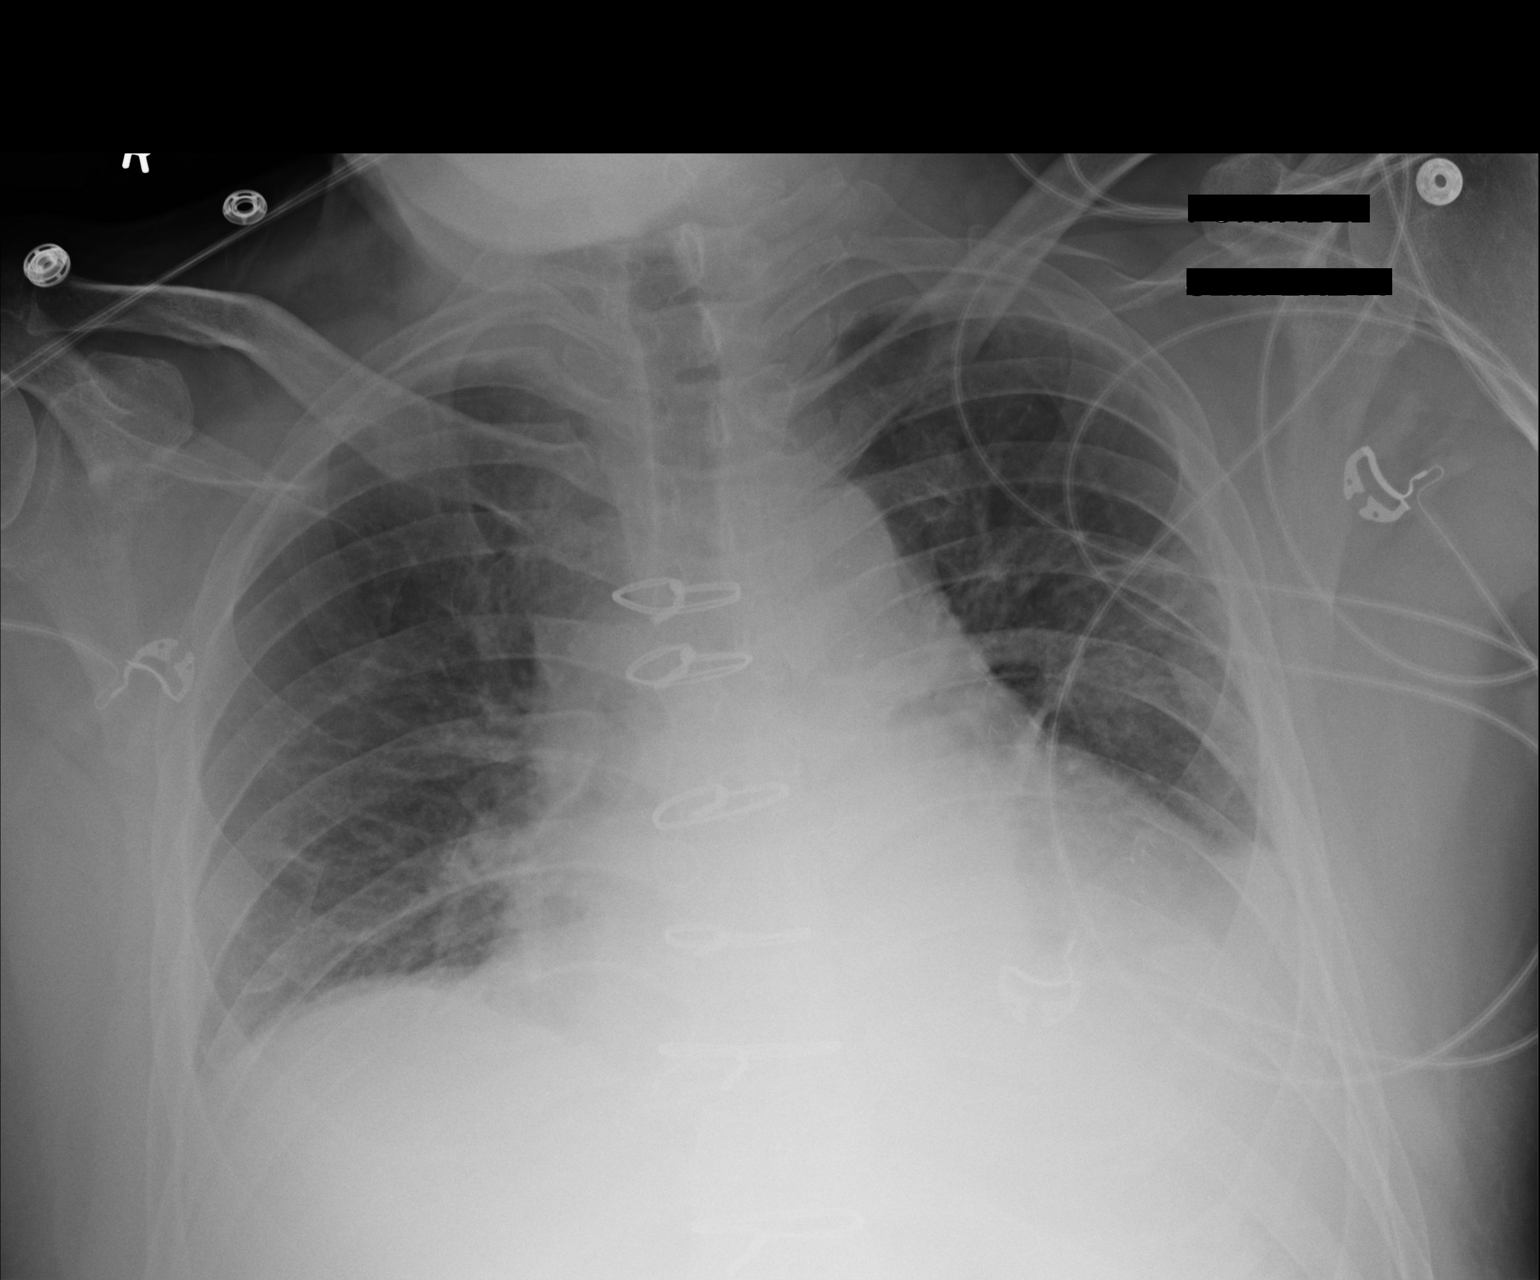

[1 of 1 positions shown; findings below may reference images not displayed]

FINDINGS: Interim removal Swan-Ganz catheter, mediastinal drainage catheter,
left chest tube. Prior CABG. Stable cardiomegaly. Persistent low
lung volumes with mild bibasilar and left mid lung subsegmental
atelectasis and or mild infiltrate/edema. Small bilateral pleural
effusions. Apical pleural thickening noted consistent scarring.
IMPRESSION: 1. Interim removal of Swan-Ganz catheter, mediastinal drainage
catheter, and left chest tube. No pneumothorax .
2. Prior CABG.  Stable cardiomegaly.
3. Persistent low lung volumes with mild bibasilar and left mid lung
field subsegmental atelectasis and/or infiltrates/edema. Small
bilateral pleural effusions .

## 2017-08-07 IMAGING — DX DG CHEST 2V
2 series · 2 of 2 positions shown · non-contrast
Comparison: 02/27/2016.  02/21/2016.  01/11/2016.

CLINICAL DATA: CABG.

EXAM:
CHEST  2 VIEW

[w chest pa]
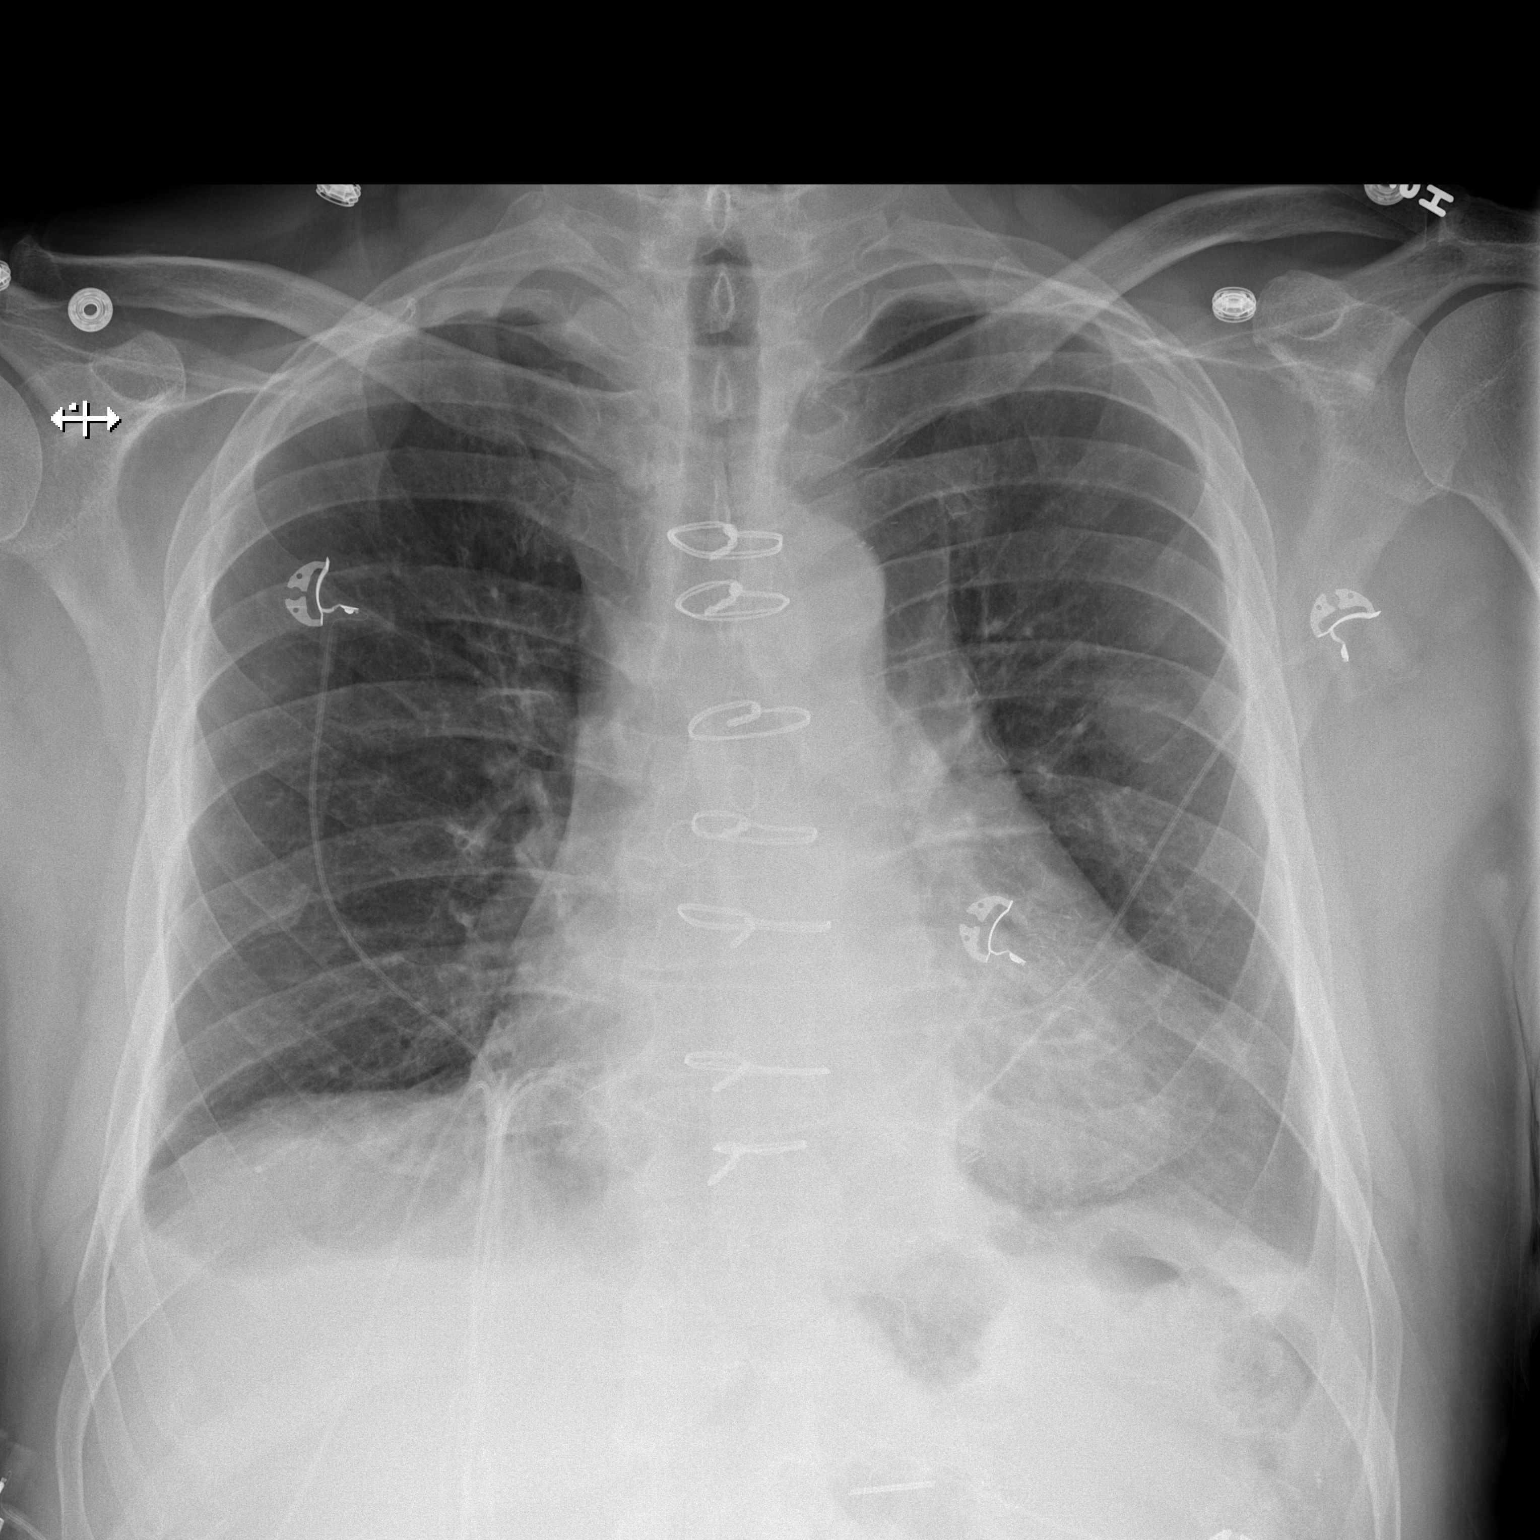

[w chest lat]
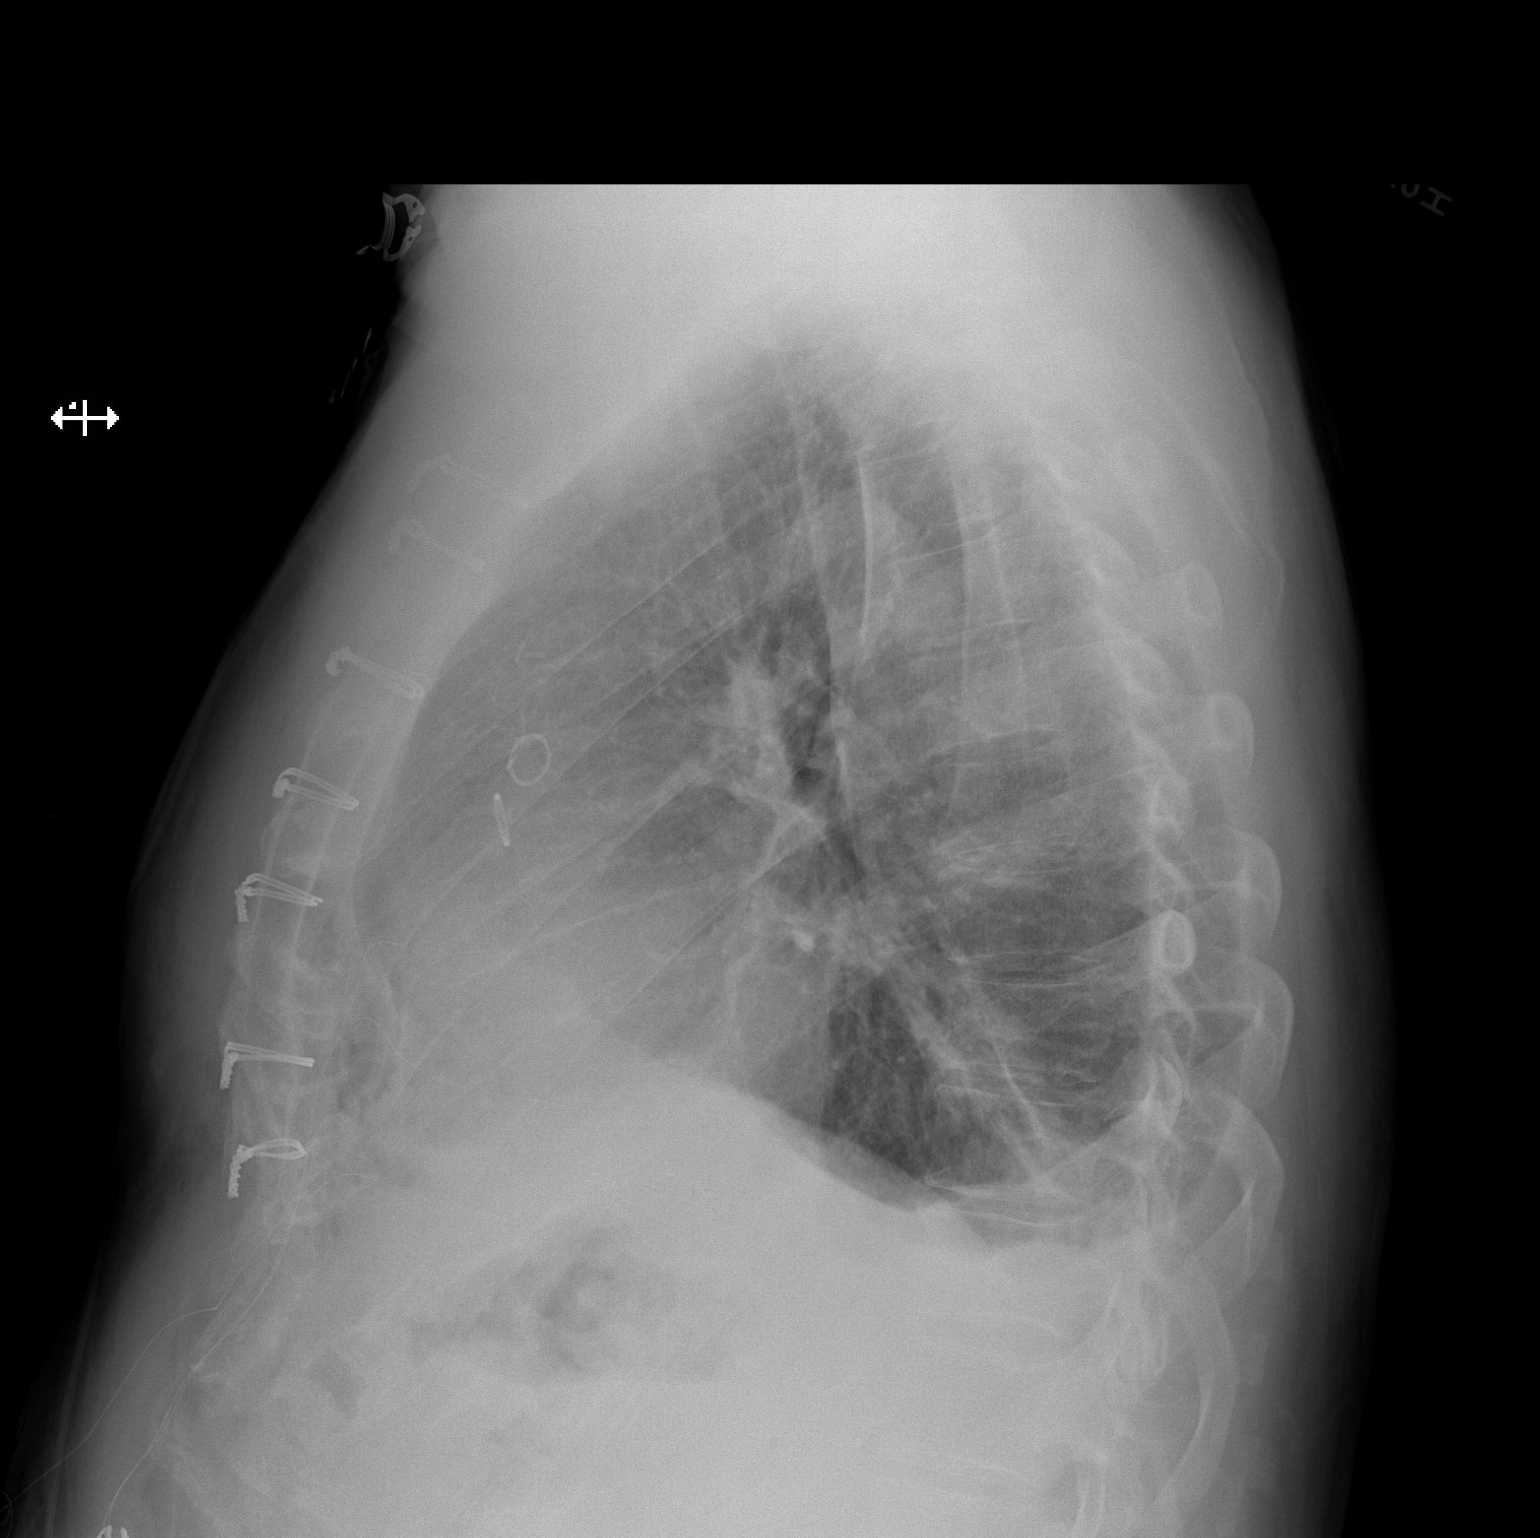

[2 of 2 positions shown; findings below may reference images not displayed]

FINDINGS: Prior CABG. Cardiomegaly with normal pulmonary vascularity. Low lung
volumes with mild bibasilar atelectasis. Small bilateral pleural
effusions.
IMPRESSION: 1. Questionable mild infiltrate left mid lung. Follow-up chest
x-rays recommended demonstrate complete clearing.

2. Prior CABG. Cardiomegaly. No pulmonary venous congestion . Low
lung volumes with bibasilar subsegment atelectasis. Small bilateral
pleural effusions.

## 2017-08-12 ENCOUNTER — Encounter: Payer: Self-pay | Admitting: Physician Assistant

## 2017-08-19 ENCOUNTER — Encounter: Payer: Self-pay | Admitting: Physician Assistant

## 2017-08-19 NOTE — Progress Notes (Signed)
Cardiology Office Note    Date:  08/20/2017  ID:  Bobby Price, DOB [birth year], MRN [id number] PCP:  Patient, No Pcp Per  Cardiologist:  Dr. Clifton James   Chief Complaint: f/u CAD, noting SOB for the last 8-12 months  History of Present Illness:  Bobby Price is a 72 y.o. male with history of CAD (BMS to RCA 01/2016 and subsequent CABGx4 in 02/2016), HTN, HLD, GERD, RBBB, asthma as a child who presents for overdue follow-up.   He initially presented in early July 2017 with acute chest pain, positive cardiac enzymes and inferior STE on ECG and underwent urgent catheterization. This demonstrated a totally occluded RCA with significant left main and multivessel CAD as well. The patient underwent a bare-metal stent to reperfuse the RCA with good result. The plan was to allow the patient to recover from the RCA occlusion-MI and proceed with multivessel CABG after Brilinta could be safely stopped one month after PCI. 2D echo 01/12/16 showed EF 60-65%, technically difficult study. The patient underwent CABG x4V (LIMA--> LAD, SVG--> post desc, SVG--> OM1//OM2) on 02/25/2016. He had some post-op volume overload and pleural effusion treated with IV Lasix. He was last seen by Dr. Donata Clay 05/06/16 and advised that he could stop Plavix after January but he never did so. Last labs are from 2017 when K 3.8, Cr 0.96, Hgb 10.8 (post-op), LFTs OK, A1C 5.8, LDL 157.  He returns for overdue follow-up, last seen in 2017. He reports over the last 8-12 months he has noticed persistent dyspnea, particularly with exertion or recumbency, associated with wheezing. It came on in winter of last year and has remained at this same level ever since. He wonders if he needs to restart his fluid pill as he thinks this was somewhat similar to his post op fluid overload but he's not quite sure. He's been using his wife's nebulizer machine with improvement. He has history of childhood asthma but grew out of this. He smoked for 11  years and quit in 1972. He denies significant secondhand smoke exposure. He ran out of Plavix a few days ago. He just lost his wife within the last few weeks from COPD. He has noticed some poking chest discomfort only when coughing.  He also has noticed a year's worth of faint splotches over varying parts of his body, legs and back, improved with cortisone cream. No specific precipitants. It is not itchy.   Past Medical History:  Diagnosis Date  . Asthma    as a child  . Coronary artery disease    a. BMS to RCA 01/2016. b. subsequent CABGx4 in 02/2016.  Marland Kitchen GERD (gastroesophageal reflux disease)   . Hyperlipidemia   . Hypertension   . Myocardial infarction (HCC)   . Nocturia   . RBBB   . Volume overload    a. post-op after bypass including pleural effusion.  . Wears glasses     Past Surgical History:  Procedure Laterality Date  . CARDIAC CATHETERIZATION N/A 01/11/2016   Procedure: Left Heart Cath and Coronary Angiography;  Surgeon: [name], MD;  Location: [facility] INVASIVE CV LAB;  Service: Cardiovascular;  Laterality: N/A;  . COLONOSCOPY    . CORONARY ANGIOPLASTY    . CORONARY ARTERY BYPASS GRAFT N/A 02/25/2016   Procedure: CORONARY ARTERY BYPASS GRAFTING (CABG)X4  LIMA-LAD; SEQ SVG-OM1-OM2; SVG-PD ENDOSCOPIC GREATER SAPHENOUS VEIN HARVEST(EVH) RIGHT LEG;  Surgeon: [name], MD;  Location: [facility] OR;  Service: Open Heart Surgery;  Laterality: N/A;  .  INTRAOPERATIVE TRANSESOPHAGEAL ECHOCARDIOGRAM N/A 02/25/2016   Procedure: INTRAOPERATIVE TRANSESOPHAGEAL ECHOCARDIOGRAM;  Surgeon: Kerin PernaPeter Van Trigt, MD;  [facility];  Service: Open Heart Surgery;  Laterality: N/A;    Current Medications: Current Meds  Medication Sig  . aspirin 81 MG chewable tablet Chew 81 mg by mouth daily.  Marland Kitchen. atorvastatin (LIPITOR) 80 MG tablet Take 1 tablet (80 mg total) by mouth daily at 6 PM.  . clopidogrel (PLAVIX) 75 MG tablet Take 1 tablet (75 mg total) by mouth daily.  Marland Kitchen. lisinopril  (PRINIVIL,ZESTRIL) 5 MG tablet Take 1 tablet (5 mg total) by mouth daily.  . [DISCONTINUED] atorvastatin (LIPITOR) 80 MG tablet Take 1 tablet (80 mg total) by mouth daily at 6 PM. Please make an appt for more refills thank you  . [DISCONTINUED] lisinopril (PRINIVIL,ZESTRIL) 5 MG tablet Take 1 tablet (5 mg total) by mouth daily. Please call and schedule an appt.  . [DISCONTINUED] metoprolol tartrate (LOPRESSOR) 25 MG tablet TAKE 1/2 TABLET(12.5 MG) BY MOUTH TWICE DAILY   Allergies:   Patient has no known allergies.   Social History   Socioeconomic History  . Marital status: Married    Spouse name: None  . Number of children: None  . Years of education: None  . Highest education level: None  Social Needs  . Financial resource strain: None  . Food insecurity - worry: None  . Food insecurity - inability: None  . Transportation needs - medical: None  . Transportation needs - non-medical: None  Occupational History  . None  Tobacco Use  . Smoking status: Former Smoker    Packs/day: 4.00    Years: 10.00    Pack years: 40.00    Types: Cigarettes  . Smokeless tobacco: Never Used  . Tobacco comment: quit in 1976  Substance and Sexual Activity  . Alcohol use: Yes    Alcohol/week: 0.0 oz    Comment: He drinks up to 6 beers per day  . Drug use: No  . Sexual activity: None  Other Topics Concern  . None  Social History Narrative  . None     Family History:  Family History  Problem Relation Age of Onset  . Heart attack Maternal Grandfather    ROS:   Please see the history of present illness.  All other systems are reviewed and otherwise negative.    PHYSICAL EXAM:   VS:  BP 122/72   Pulse (!) 59   Ht 6\' 2"  (1.88 m)   Wt 228 lb 1.9 oz (103.5 kg)   SpO2 95%   BMI 29.29 kg/m   BMI: Body mass index is 29.29 kg/m. GEN: Well nourished, well developed WM, in no acute distress  HEENT: normocephalic, atraumatic Neck: no JVD, carotid bruits, or masses Cardiac: RRR; no murmurs,  rubs, or gallops, no edema  Respiratory: moderate air movement, squeaky wheezing noted in RLL and LML, mildly decreased BS at L Base, normal work of breathing GI: soft, nontender, nondistended, + BS MS: no deformity or atrophy  Skin: warm and dry, no rash Neuro:  Alert and Oriented x 3, Strength and sensation are intact, follows commands Psych: euthymic mood, full affect  Wt Readings from Last 3 Encounters:  08/20/17 228 lb 1.9 oz (103.5 kg)  05/06/16 224 lb (101.6 kg)  04/15/16 224 lb 1.9 oz (101.7 kg)      Studies/Labs Reviewed:   EKG:  EKG was ordered today and personally reviewed by me and demonstrates sinus bradycardia 59bpm, nonspecific ST-T Changes, similar to  prior.  Recent Labs: No results found for requested labs within last 8760 hours.   Lipid Panel    Component Value Date/Time   CHOL 228 (H) 01/11/2016 1807   TRIG 116 01/11/2016 1807   HDL 48 01/11/2016 1807   CHOLHDL 4.8 01/11/2016 1807   VLDL 23 01/11/2016 1807   LDLCALC 157 (H) 01/11/2016 1807    Additional studies/ records that were reviewed today include: Summarized above.    ASSESSMENT & PLAN:   1. Shortness of breath/wheezing - differential includes reactive airway process such as asthma/COPD versus CHF. Will obtain stat labs today including BNP, Cr, TSH and check 2V CXR. Update echocardiogram. Will rx albuterol inhaler to use q4hour PRN SOB/wheezing and initiate trial of Lasix 20mg  daily. Hold metoprolol for now given wheezing. May need to consider referral to pulm if CHF workup unrevealing. He is not tachycardic, tachypneic or hypoxic. 2. CAD - continue ASA for now. I will reach out to Dr. Clifton James for input on whether we want to continue Plavix or not. Hold BB due to increased wheezing as above. Continue statin. Check LFTs and lipids today. Will also provide rx for SL NTG to have on hand. Recent poking chest pain  3. HTN - initial BP 140/72, recheck 122/72 by me. Holding BB as above, adding Lasix.  Follow. 4. Hyperlipidemia - continue statin as above. 5. Rash - nonspecific, does not look like a drug rash - no urticaria or papules, only faint flat plaques. Advised he visit urgent care/primary care within the next day or so. Per his report this not an acute process and he has no airway symptoms. Warning sx discussed.  Disposition: F/u with me/Dr. McAlhany's care team within 2 weeks' time.  Medication Adjustments/Labs and Tests Ordered: Current medicines are reviewed at length with the patient today.  Concerns regarding medicines are outlined above. Medication changes, Labs and Tests ordered today are summarized above and listed in the Patient Instructions accessible in Encounters.   Signed, Laurann Montana, PA-C  08/20/2017 9:07 AM    [facility] [address] [place], [place], [place]  [address] Phone: [phone]; Fax: [phone]

## 2017-08-20 ENCOUNTER — Encounter: Payer: Self-pay | Admitting: Physician Assistant

## 2017-08-20 ENCOUNTER — Other Ambulatory Visit: Payer: Self-pay | Admitting: Physician Assistant

## 2017-08-20 ENCOUNTER — Ambulatory Visit (INDEPENDENT_AMBULATORY_CARE_PROVIDER_SITE_OTHER): Payer: Medicare Other | Admitting: Physician Assistant

## 2017-08-20 ENCOUNTER — Ambulatory Visit
Admission: RE | Admit: 2017-08-20 | Discharge: 2017-08-20 | Disposition: A | Discharge status: Home / Self Care | Payer: Medicare Other | Source: Ambulatory Visit | Attending: Physician Assistant | Admitting: Physician Assistant

## 2017-08-20 VITALS — BP 122/72 | HR 59 | Ht 74.0 in | Wt 228.1 lb

## 2017-08-20 DIAGNOSIS — I2510 Atherosclerotic heart disease of native coronary artery without angina pectoris: Secondary | ICD-10-CM | POA: Diagnosis not present

## 2017-08-20 DIAGNOSIS — Z9189 Other specified personal risk factors, not elsewhere classified: Secondary | ICD-10-CM

## 2017-08-20 DIAGNOSIS — Z951 Presence of aortocoronary bypass graft: Secondary | ICD-10-CM

## 2017-08-20 DIAGNOSIS — R0602 Shortness of breath: Secondary | ICD-10-CM | POA: Diagnosis not present

## 2017-08-20 DIAGNOSIS — I10 Essential (primary) hypertension: Secondary | ICD-10-CM

## 2017-08-20 DIAGNOSIS — E785 Hyperlipidemia, unspecified: Secondary | ICD-10-CM

## 2017-08-20 DIAGNOSIS — R062 Wheezing: Secondary | ICD-10-CM

## 2017-08-20 DIAGNOSIS — R21 Rash and other nonspecific skin eruption: Secondary | ICD-10-CM

## 2017-08-20 DIAGNOSIS — R05 Cough: Secondary | ICD-10-CM | POA: Diagnosis not present

## 2017-08-20 LAB — CBC
Hematocrit: 39.6 % (ref 37.5–51.0)
Hemoglobin: 13.6 g/dL (ref 13.0–17.7)
MCH: 30.6 pg (ref 26.6–33.0)
MCHC: 34.3 g/dL (ref 31.5–35.7)
MCV: 89 fL (ref 79–97)
Platelets: 191 10*3/uL (ref 150–379)
RBC: 4.44 x10E6/uL (ref 4.14–5.80)
RDW: 13.3 % (ref 12.3–15.4)
WBC: 9.3 10*3/uL (ref 3.4–10.8)

## 2017-08-20 LAB — COMPREHENSIVE METABOLIC PANEL
A/G RATIO: 1.5 (ref 1.2–2.2)
ALBUMIN: 4.3 g/dL (ref 3.6–4.8)
ALT: 23 IU/L (ref 0–44)
AST: 23 IU/L (ref 0–40)
Alkaline Phosphatase: 80 IU/L (ref 39–117)
BILIRUBIN TOTAL: 0.9 mg/dL (ref 0.0–1.2)
BUN / CREAT RATIO: 12 (ref 10–24)
BUN: 14 mg/dL (ref 8–27)
CHLORIDE: 105 mmol/L (ref 96–106)
CO2: 23 mmol/L (ref 20–29)
Calcium: 9.3 mg/dL (ref 8.6–10.2)
Creatinine, Ser: 1.14 mg/dL (ref 0.76–1.27)
GFR, EST AFRICAN AMERICAN: 75 mL/min/{1.73_m2} (ref >59)
GFR, EST NON AFRICAN AMERICAN: 65 mL/min/{1.73_m2} (ref >59)
GLUCOSE: 100 mg/dL — AB (ref 65–99)
Globulin, Total: 2.8 g/dL (ref 1.5–4.5)
Potassium: 4.9 mmol/L (ref 3.5–5.2)
Sodium: 142 mmol/L (ref 134–144)
TOTAL PROTEIN: 7.1 g/dL (ref 6.0–8.5)

## 2017-08-20 LAB — PRO B NATRIURETIC PEPTIDE: NT-Pro BNP: 180 pg/mL (ref 0–376)

## 2017-08-20 MED ORDER — NITROGLYCERIN 0.4 MG SL SUBL: 0.4000 mg | SUBLINGUAL_TABLET | SUBLINGUAL | 3 refills | Status: AC | PRN

## 2017-08-20 MED ORDER — ALBUTEROL SULFATE HFA 108 (90 BASE) MCG/ACT IN AERS: 2.0000 | INHALATION_SPRAY | RESPIRATORY_TRACT | 1 refills | Status: DC | PRN

## 2017-08-20 MED ORDER — LISINOPRIL 5 MG PO TABS: 5.0000 mg | ORAL_TABLET | Freq: Every day | ORAL | 3 refills | Status: DC

## 2017-08-20 MED ORDER — ATORVASTATIN CALCIUM 80 MG PO TABS: 80.0000 mg | ORAL_TABLET | Freq: Every day | ORAL | 3 refills | Status: DC

## 2017-08-20 MED ORDER — FUROSEMIDE 20 MG PO TABS: 20.0000 mg | ORAL_TABLET | Freq: Every day | ORAL | 3 refills | Status: AC

## 2017-08-20 NOTE — Addendum Note (Signed)
Addended by: Burnetta SabinWITTY, Jaycob Mcclenton K on: 08/20/2017 11:57 AM   Modules accepted: Orders

## 2017-08-20 NOTE — Patient Instructions (Addendum)
Medication Instructions:  Your physician has recommended you make the following change in your medication:  1.  HOLD the Metoprolol for now (do not throw it away) 2.  START Lasix 20 mg daily 3.  START NItroglycerin 0.4s/l tablet USE AS NEEDED AS DIRECTED ON THE BOTTLE   Labwork: TODAY:  STAT: CMET, CBC, & PRO BNP TODAY:  TSH & LIPID  Testing/Procedures: A chest x-ray takes a picture of the organs and structures inside the chest, including the heart, lungs, and blood vessels. This test can show several things, including, whether the heart is enlarges; whether fluid is building up in the lungs; and whether pacemaker / defibrillator leads are still in place.  [facility]. [address] [place], [place] [id number]  Your physician has requested that you have an echocardiogram. Echocardiography is a painless test that uses sound waves to create images of your heart. It provides your doctor with information about the size and shape of your heart and how well your heart's chambers and valves are working. This procedure takes approximately one hour. There are no restrictions for this procedure.   Follow-Up: Your physician recommends that you schedule a follow-up appointment in: 2 WEEKS WITH DR. Clifton JamesMCALHANY OR Terance HartAYANA DUNN, PA-C   Any Other Special Instructions Will Be Listed Below (If Applicable).  X-rays X-rays are tests that create pictures of the inside of your body using radiation. Different body parts absorb different amounts of radiation, which show up on the X-ray pictures in shades of black, gray, and white. X-rays are used to look for many health conditions, including broken bones, lung problems, and some types of cancer. Tell a health care provider about:  Any allergies you have.  All medicines you are taking, including vitamins, herbs, eye drops, creams, and over-the-counter medicines.  Previous surgeries you have had.  Medical  conditions you have. What are the risks? Getting an X-ray is a safe procedure. What happens before the procedure?  Tell the X-ray technician if you are pregnant or might be pregnant.  You may be asked to wear a protective lead apron to hide parts of your body from the X-ray.  You usually will need to undress whatever part of your body needs the X-ray. You will be given a hospital gown to wear, if needed.  You may need to remove your glasses, jewelry, and other metal objects. What happens during the procedure?  The X-ray machine creates a picture by using a tiny burst of radiation. It is painless.  You may need to have several pictures taken at different angles.  You will need to try to be as still as you can during the examination to get the best possible images. What happens after the procedure?  You will be able to resume your normal activities.  The X-ray will be examined by your health care provider or a radiology specialist.  It is your responsibility to get your test results. Ask your health care provider when to expect your results and how to get them. This information is not intended to replace advice given to you by your health care provider. Make sure you discuss any questions you have with your health care provider. Document Released: 06/29/2005 Document Revised: 02/27/2016 Document Reviewed: 08/23/2013 Elsevier Interactive Patient Education  2018 [employer]   Echocardiogram An echocardiogram, or echocardiography, uses sound waves (ultrasound) to produce an image of your heart. The echocardiogram is simple, painless, obtained within a short period  of time, and offers valuable information to your health care provider. The images from an echocardiogram can provide information such as:  Evidence of coronary artery disease (CAD).  Heart size.  Heart muscle function.  Heart valve function.  Aneurysm detection.  Evidence of a past heart attack.  Fluid buildup  around the heart.  Heart muscle thickening.  Assess heart valve function.  Tell a health care provider about:  Any allergies you have.  All medicines you are taking, including vitamins, herbs, eye drops, creams, and over-the-counter medicines.  Any problems you or family members have had with anesthetic medicines.  Any blood disorders you have.  Any surgeries you have had.  Any medical conditions you have.  Whether you are pregnant or may be pregnant. What happens before the procedure? No special preparation is needed. Eat and drink normally. What happens during the procedure?  In order to produce an image of your heart, gel will be applied to your chest and a wand-like tool (transducer) will be moved over your chest. The gel will help transmit the sound waves from the transducer. The sound waves will harmlessly bounce off your heart to allow the heart images to be captured in real-time motion. These images will then be recorded.  You may need an IV to receive a medicine that improves the quality of the pictures. What happens after the procedure? You may return to your normal schedule including diet, activities, and medicines, unless your health care provider tells you otherwise. This information is not intended to replace advice given to you by your health care provider. Make sure you discuss any questions you have with your health care provider. Document Released: 06/26/2000 Document Revised: 02/15/2016 Document Reviewed: 03/06/2013 Elsevier Interactive Patient Education  2017 [employer].   If you need a refill on your cardiac medications before your next appointment, please call your pharmacy.

## 2017-08-21 ENCOUNTER — Encounter: Payer: Self-pay | Admitting: Physician Assistant

## 2017-08-23 LAB — COMPREHENSIVE METABOLIC PANEL
A/G RATIO: 1.5 (ref 1.2–2.2)
ALK PHOS: 80 IU/L (ref 39–117)
ALT: 23 IU/L (ref 0–44)
AST: 23 IU/L (ref 0–40)
Albumin: 4.3 g/dL (ref 3.6–4.8)
BILIRUBIN TOTAL: 0.9 mg/dL (ref 0.0–1.2)
BUN/Creatinine Ratio: 12 (ref 10–24)
BUN: 14 mg/dL (ref 8–27)
CHLORIDE: 105 mmol/L (ref 96–106)
CO2: 23 mmol/L (ref 20–29)
Calcium: 9.3 mg/dL (ref 8.6–10.2)
Creatinine, Ser: 1.14 mg/dL (ref 0.76–1.27)
GFR calc non Af Amer: 65 mL/min/{1.73_m2} (ref >59)
GFR, EST AFRICAN AMERICAN: 75 mL/min/{1.73_m2} (ref >59)
Globulin, Total: 2.8 g/dL (ref 1.5–4.5)
Glucose: 100 mg/dL — ABNORMAL HIGH (ref 65–99)
POTASSIUM: 4.9 mmol/L (ref 3.5–5.2)
Sodium: 142 mmol/L (ref 134–144)
TOTAL PROTEIN: 7.1 g/dL (ref 6.0–8.5)

## 2017-08-23 LAB — CBC
HEMOGLOBIN: 13.6 g/dL (ref 13.0–17.7)
Hematocrit: 39.6 % (ref 37.5–51.0)
MCH: 30.6 pg (ref 26.6–33.0)
MCHC: 34.3 g/dL (ref 31.5–35.7)
MCV: 89 fL (ref 79–97)
Platelets: 191 10*3/uL (ref 150–379)
RBC: 4.44 x10E6/uL (ref 4.14–5.80)
RDW: 13.3 % (ref 12.3–15.4)
WBC: 9.3 10*3/uL (ref 3.4–10.8)

## 2017-08-23 LAB — PRO B NATRIURETIC PEPTIDE: NT-PRO BNP: 180 pg/mL (ref 0–376)

## 2017-08-24 LAB — SPECIMEN STATUS REPORT

## 2017-08-24 NOTE — Telephone Encounter (Signed)
I called pt re: pt's email and response from Ronie Spiesayna Dunn, [place]C.   Pt has been advised: 1.  that he didn't have to fill the Lasix since he didn't show any fluid build up. 2.  That Xrays doesn't show any kinds of masses, that a CT would have to be ordered, if he was concerned about that,  PT DECLINED 3.  That he needed to stay off the Metoprolol until further assessed 4.  To follow up with :Pulmonary, PT DECLINED, said they could never do anything for his wife, maybe $10,000 worth of tests, and he wasn't willing to see them. 5.  PT DECLINED follow up with Ronie Spiesayna Dunn, PA-C, for reassess his symptoms. Pt stated that if he felt worse, he would call us. Pt was advised that he just couldn't stay off the Metoprolol and not f/u with anyone and that he needed an appt. Again, Pt declined.  I advised pt I would let Dayna know about our conversation.  Pt thanked me for the call.

## 2017-08-25 ENCOUNTER — Other Ambulatory Visit (HOSPITAL_COMMUNITY): Payer: Medicare Other

## 2017-08-25 LAB — COMPREHENSIVE METABOLIC PANEL
ALBUMIN: 4.3 g/dL (ref 3.6–4.8)
ALT: 23 IU/L (ref 0–44)
AST: 19 IU/L (ref 0–40)
Albumin/Globulin Ratio: 1.5 (ref 1.2–2.2)
Alkaline Phosphatase: 79 IU/L (ref 39–117)
BUN / CREAT RATIO: 11 (ref 10–24)
BUN: 13 mg/dL (ref 8–27)
Bilirubin Total: 0.9 mg/dL (ref 0.0–1.2)
CALCIUM: 9.4 mg/dL (ref 8.6–10.2)
CO2: 21 mmol/L (ref 20–29)
CREATININE: 1.17 mg/dL (ref 0.76–1.27)
Chloride: 106 mmol/L (ref 96–106)
GFR, EST AFRICAN AMERICAN: 73 mL/min/{1.73_m2} (ref >59)
GFR, EST NON AFRICAN AMERICAN: 63 mL/min/{1.73_m2} (ref >59)
GLOBULIN, TOTAL: 2.8 g/dL (ref 1.5–4.5)
Glucose: 98 mg/dL (ref 65–99)
Potassium: 4.7 mmol/L (ref 3.5–5.2)
SODIUM: 140 mmol/L (ref 134–144)
Total Protein: 7.1 g/dL (ref 6.0–8.5)

## 2017-08-25 LAB — CBC

## 2017-08-25 LAB — LIPID PANEL
CHOL/HDL RATIO: 3.2 ratio (ref 0.0–5.0)
Cholesterol, Total: 127 mg/dL (ref 100–199)
HDL: 40 mg/dL (ref >39)
LDL Calculated: 70 mg/dL (ref 0–99)
Triglycerides: 86 mg/dL (ref 0–149)
VLDL CHOLESTEROL CAL: 17 mg/dL (ref 5–40)

## 2017-08-25 LAB — TSH: TSH: 1.32 u[IU]/mL (ref 0.450–4.500)

## 2017-08-25 LAB — PRO B NATRIURETIC PEPTIDE: NT-PRO BNP: 180 pg/mL (ref 0–376)

## 2017-08-30 ENCOUNTER — Other Ambulatory Visit (HOSPITAL_COMMUNITY): Payer: Medicare Other

## 2017-08-30 ENCOUNTER — Ambulatory Visit: Payer: Medicare Other | Admitting: Physician Assistant

## 2017-12-28 ENCOUNTER — Other Ambulatory Visit: Payer: Self-pay | Admitting: Physician Assistant

## 2018-03-02 ENCOUNTER — Other Ambulatory Visit: Payer: Self-pay | Admitting: Physician Assistant

## 2018-03-02 NOTE — Telephone Encounter (Signed)
OK to refill for 1 inhaler, but please inform patient of essential need to either f/u PCP or pulm as instructed. Also offer f/u rescheduling in our office as well. Latoyia Tecson PA-C

## 2018-03-02 NOTE — Telephone Encounter (Signed)
I spoke with pt and told him we could refill inhaler for one month and asked him to follow up with primary care or pulmonary.  He does not want to see pulmonary but will follow up with primary care. I made him aware further refills for inhaler should come from primary care. I offered to schedule an appointment for him in our office but he is unable to schedule now. He states he will call back to schedule this appointment.

## 2018-03-02 NOTE — Telephone Encounter (Signed)
Albuterol was ordered by Ronie Spiesayna Dunn, PA at February office visit. Pt cancelled follow up in our office. Cancelled echo.  Per patient message dated 08/21/17 pt declined pulmonary follow up. He has no primary care physician listed. Will forward to Ronie Spiesayna Dunn, PA to for recommendations as to whether this can be refilled by our office.

## 2018-03-02 NOTE — Telephone Encounter (Signed)
Pt's pharmacy is requesting a refill on albuterol. Would Dr. Clifton JamesMcalhany like to refill this medication? Please address

## 2018-08-18 ENCOUNTER — Other Ambulatory Visit: Payer: Self-pay | Admitting: Physician Assistant

## 2018-09-27 ENCOUNTER — Other Ambulatory Visit: Payer: Self-pay | Admitting: Physician Assistant

## 2018-10-04 ENCOUNTER — Other Ambulatory Visit: Payer: Self-pay | Admitting: Physician Assistant

## 2019-02-15 ENCOUNTER — Other Ambulatory Visit: Payer: Self-pay | Admitting: Physician Assistant

## 2019-02-18 ENCOUNTER — Other Ambulatory Visit: Payer: Self-pay | Admitting: Physician Assistant

## 2019-02-21 ENCOUNTER — Other Ambulatory Visit: Payer: Self-pay | Admitting: Physician Assistant

## 2019-03-13 ENCOUNTER — Other Ambulatory Visit: Payer: Self-pay | Admitting: Physician Assistant

## 2019-03-24 ENCOUNTER — Other Ambulatory Visit: Payer: Self-pay | Admitting: Physician Assistant

## 2019-03-27 ENCOUNTER — Other Ambulatory Visit: Payer: Self-pay | Admitting: Physician Assistant

## 2019-03-29 ENCOUNTER — Other Ambulatory Visit: Payer: Self-pay | Admitting: Physician Assistant

## 2019-09-26 DIAGNOSIS — Z23 Encounter for immunization: Secondary | ICD-10-CM | POA: Diagnosis not present

## 2019-10-24 DIAGNOSIS — Z23 Encounter for immunization: Secondary | ICD-10-CM | POA: Diagnosis not present
